# Patient Record
Sex: Female | Born: 1945
Health system: Southern US, Community
[De-identification: ages and names within clinical notes are randomized; demographics above are authoritative.]

## PROBLEM LIST (undated history)

## (undated) DIAGNOSIS — I10 Essential (primary) hypertension: Secondary | ICD-10-CM

## (undated) DIAGNOSIS — M48061 Spinal stenosis, lumbar region without neurogenic claudication: Secondary | ICD-10-CM

## (undated) DIAGNOSIS — J309 Allergic rhinitis, unspecified: Secondary | ICD-10-CM

## (undated) DIAGNOSIS — K589 Irritable bowel syndrome without diarrhea: Secondary | ICD-10-CM

## (undated) DIAGNOSIS — K219 Gastro-esophageal reflux disease without esophagitis: Secondary | ICD-10-CM

## (undated) DIAGNOSIS — Z860100 Personal history of colon polyps, unspecified: Secondary | ICD-10-CM

## (undated) DIAGNOSIS — K760 Fatty (change of) liver, not elsewhere classified: Secondary | ICD-10-CM

## (undated) DIAGNOSIS — K648 Other hemorrhoids: Secondary | ICD-10-CM

## (undated) DIAGNOSIS — M109 Gout, unspecified: Secondary | ICD-10-CM

## (undated) DIAGNOSIS — B9681 Helicobacter pylori [H. pylori] as the cause of diseases classified elsewhere: Secondary | ICD-10-CM

## (undated) DIAGNOSIS — E785 Hyperlipidemia, unspecified: Secondary | ICD-10-CM

## (undated) DIAGNOSIS — D126 Benign neoplasm of colon, unspecified: Secondary | ICD-10-CM

## (undated) DIAGNOSIS — Z9289 Personal history of other medical treatment: Secondary | ICD-10-CM

## (undated) HISTORY — DX: Fatty (change of) liver, not elsewhere classified: K76.0

## (undated) HISTORY — DX: Essential (primary) hypertension: I10

## (undated) HISTORY — DX: Gastro-esophageal reflux disease without esophagitis: K21.9

## (undated) HISTORY — DX: Personal history of colonic polyps: Z86.010

## (undated) HISTORY — DX: Benign neoplasm of colon, unspecified: D12.6

## (undated) HISTORY — DX: Gout, unspecified: M10.9

## (undated) HISTORY — DX: Hyperlipidemia, unspecified: E78.5

## (undated) HISTORY — DX: Personal history of colon polyps, unspecified: Z86.0100

## (undated) HISTORY — DX: Personal history of other medical treatment: Z92.89

## (undated) HISTORY — DX: Irritable bowel syndrome, unspecified: K58.9

## (undated) HISTORY — DX: Helicobacter pylori (H. pylori) as the cause of diseases classified elsewhere: B96.81

## (undated) HISTORY — DX: Spinal stenosis, lumbar region without neurogenic claudication: M48.061

## (undated) HISTORY — DX: Other hemorrhoids: K64.8

## (undated) HISTORY — DX: Allergic rhinitis, unspecified: J30.9

---
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
## 1983-12-11 HISTORY — PX: BREAST BIOPSY: SHX20

## 1988-12-10 HISTORY — PX: TOTAL ABDOMINAL HYSTERECTOMY W/ BILATERAL SALPINGOOPHORECTOMY: SHX83

## 1998-10-10 ENCOUNTER — Encounter: Payer: Self-pay | Admitting: Surgery

## 1998-10-10 ENCOUNTER — Ambulatory Visit: Admission: RE | Admit: 1998-10-10 | Discharge: 1998-10-10 | Payer: Self-pay | Admitting: Surgery

## 1999-12-19 ENCOUNTER — Other Ambulatory Visit: Admission: RE | Admit: 1999-12-19 | Discharge: 1999-12-19 | Payer: Self-pay | Admitting: Obstetrics and Gynecology

## 2000-01-12 ENCOUNTER — Encounter: Payer: Self-pay | Admitting: Surgery

## 2000-01-12 ENCOUNTER — Encounter: Admission: RE | Admit: 2000-01-12 | Discharge: 2000-01-12 | Payer: Self-pay | Admitting: Surgery

## 2000-02-29 ENCOUNTER — Encounter: Payer: Self-pay | Admitting: Emergency Medicine

## 2000-02-29 ENCOUNTER — Emergency Department (HOSPITAL_COMMUNITY): Admission: EM | Admit: 2000-02-29 | Discharge: 2000-02-29 | Payer: Self-pay | Admitting: Emergency Medicine

## 2000-09-12 ENCOUNTER — Other Ambulatory Visit: Admission: RE | Admit: 2000-09-12 | Discharge: 2000-09-12 | Payer: Self-pay | Admitting: Surgery

## 2000-09-19 ENCOUNTER — Encounter: Admission: RE | Admit: 2000-09-19 | Discharge: 2000-09-19 | Payer: Self-pay | Admitting: Surgery

## 2000-09-19 ENCOUNTER — Other Ambulatory Visit: Admission: RE | Admit: 2000-09-19 | Discharge: 2000-09-19 | Payer: Self-pay | Admitting: Surgery

## 2000-09-19 ENCOUNTER — Encounter: Payer: Self-pay | Admitting: Surgery

## 2000-09-19 ENCOUNTER — Encounter (INDEPENDENT_AMBULATORY_CARE_PROVIDER_SITE_OTHER): Payer: Self-pay | Admitting: *Deleted

## 2001-02-05 ENCOUNTER — Encounter: Payer: Self-pay | Admitting: Surgery

## 2001-02-05 ENCOUNTER — Encounter: Admission: RE | Admit: 2001-02-05 | Discharge: 2001-02-05 | Payer: Self-pay | Admitting: Surgery

## 2001-12-29 ENCOUNTER — Other Ambulatory Visit: Admission: RE | Admit: 2001-12-29 | Discharge: 2001-12-29 | Payer: Self-pay | Admitting: Surgery

## 2002-02-10 ENCOUNTER — Encounter: Admission: RE | Admit: 2002-02-10 | Discharge: 2002-02-10 | Payer: Self-pay | Admitting: Surgery

## 2002-02-10 ENCOUNTER — Encounter: Payer: Self-pay | Admitting: Surgery

## 2003-08-30 ENCOUNTER — Encounter: Admission: RE | Admit: 2003-08-30 | Discharge: 2003-08-30 | Payer: Self-pay | Admitting: Surgery

## 2003-08-30 ENCOUNTER — Encounter: Payer: Self-pay | Admitting: Surgery

## 2003-12-01 ENCOUNTER — Ambulatory Visit (HOSPITAL_COMMUNITY): Admission: RE | Admit: 2003-12-01 | Discharge: 2003-12-01 | Payer: Self-pay | Admitting: Internal Medicine

## 2006-02-18 ENCOUNTER — Encounter: Admission: RE | Admit: 2006-02-18 | Discharge: 2006-02-18 | Payer: Self-pay | Admitting: Surgery

## 2006-05-27 ENCOUNTER — Ambulatory Visit: Payer: Self-pay | Admitting: Oncology

## 2006-06-07 LAB — CBC WITH DIFFERENTIAL/PLATELET
BASO%: 0.6 % (ref 0.0–2.0)
Basophils Absolute: 0 10*3/uL (ref 0.0–0.1)
EOS%: 3.1 % (ref 0.0–7.0)
Eosinophils Absolute: 0.3 10*3/uL (ref 0.0–0.5)
HCT: 41.8 % (ref 34.8–46.6)
HGB: 14.6 g/dL (ref 11.6–15.9)
LYMPH%: 31.3 % (ref 14.0–48.0)
MCH: 31.2 pg (ref 26.0–34.0)
MCHC: 34.8 g/dL (ref 32.0–36.0)
MCV: 89.7 fL (ref 81.0–101.0)
MONO#: 0.8 10*3/uL (ref 0.1–0.9)
MONO%: 10 % (ref 0.0–13.0)
NEUT#: 4.6 10*3/uL (ref 1.5–6.5)
NEUT%: 55 % (ref 39.6–76.8)
Platelets: 298 10*3/uL (ref 145–400)
RBC: 4.66 10*6/uL (ref 3.70–5.32)
RDW: 12.6 % (ref 11.3–14.5)
WBC: 8.3 10*3/uL (ref 3.9–10.0)
lymph#: 2.6 10*3/uL (ref 0.9–3.3)

## 2006-06-07 LAB — COMPREHENSIVE METABOLIC PANEL
ALT: 53 U/L — ABNORMAL HIGH (ref 0–40)
AST: 26 U/L (ref 0–37)
Albumin: 4.6 g/dL (ref 3.5–5.2)
Alkaline Phosphatase: 65 U/L (ref 39–117)
BUN: 25 mg/dL — ABNORMAL HIGH (ref 6–23)
CO2: 26 mEq/L (ref 19–32)
Calcium: 9.2 mg/dL (ref 8.4–10.5)
Chloride: 101 mEq/L (ref 96–112)
Creatinine, Ser: 1.11 mg/dL (ref 0.40–1.20)
Glucose, Bld: 137 mg/dL — ABNORMAL HIGH (ref 70–99)
Potassium: 4.5 mEq/L (ref 3.5–5.3)
Sodium: 139 mEq/L (ref 135–145)
Total Bilirubin: 0.4 mg/dL (ref 0.3–1.2)
Total Protein: 7.1 g/dL (ref 6.0–8.3)

## 2006-06-07 LAB — IRON AND TIBC
%SAT: 35 % (ref 20–55)
Iron: 108 ug/dL (ref 42–145)
TIBC: 309 ug/dL (ref 250–470)
UIBC: 201 ug/dL

## 2006-06-07 LAB — LACTATE DEHYDROGENASE: LDH: 160 U/L (ref 94–250)

## 2006-06-07 LAB — FERRITIN: Ferritin: 195 ng/mL (ref 10–291)

## 2006-09-18 ENCOUNTER — Ambulatory Visit: Payer: Self-pay | Admitting: Oncology

## 2006-09-20 LAB — IRON AND TIBC
%SAT: 36 % (ref 20–55)
Iron: 112 ug/dL (ref 42–145)
TIBC: 315 ug/dL (ref 250–470)
UIBC: 203 ug/dL

## 2006-09-20 LAB — CBC WITH DIFFERENTIAL/PLATELET
BASO%: 0.3 % (ref 0.0–2.0)
Basophils Absolute: 0 10*3/uL (ref 0.0–0.1)
EOS%: 3.7 % (ref 0.0–7.0)
Eosinophils Absolute: 0.3 10*3/uL (ref 0.0–0.5)
HCT: 42.6 % (ref 34.8–46.6)
HGB: 14.6 g/dL (ref 11.6–15.9)
LYMPH%: 29.3 % (ref 14.0–48.0)
MCH: 30.9 pg (ref 26.0–34.0)
MCHC: 34.2 g/dL (ref 32.0–36.0)
MCV: 90.4 fL (ref 81.0–101.0)
MONO#: 0.7 10*3/uL (ref 0.1–0.9)
MONO%: 8.2 % (ref 0.0–13.0)
NEUT#: 4.8 10*3/uL (ref 1.5–6.5)
NEUT%: 58.5 % (ref 39.6–76.8)
Platelets: 276 10*3/uL (ref 145–400)
RBC: 4.72 10*6/uL (ref 3.70–5.32)
RDW: 12.8 % (ref 11.3–14.5)
WBC: 8.2 10*3/uL (ref 3.9–10.0)
lymph#: 2.4 10*3/uL (ref 0.9–3.3)

## 2006-09-20 LAB — FERRITIN: Ferritin: 280 ng/mL (ref 10–291)

## 2007-06-26 ENCOUNTER — Encounter: Admission: RE | Admit: 2007-06-26 | Discharge: 2007-06-26 | Payer: Self-pay | Admitting: Obstetrics and Gynecology

## 2008-05-19 ENCOUNTER — Encounter: Admission: RE | Admit: 2008-05-19 | Discharge: 2008-05-19 | Payer: Self-pay | Admitting: Orthopedic Surgery

## 2008-07-22 ENCOUNTER — Encounter: Admission: RE | Admit: 2008-07-22 | Discharge: 2008-07-22 | Payer: Self-pay | Admitting: Obstetrics and Gynecology

## 2009-08-25 ENCOUNTER — Encounter: Payer: Self-pay | Admitting: Gastroenterology

## 2009-08-25 ENCOUNTER — Encounter: Admission: RE | Admit: 2009-08-25 | Discharge: 2009-08-25 | Payer: Self-pay | Admitting: Internal Medicine

## 2009-08-31 ENCOUNTER — Encounter: Admission: RE | Admit: 2009-08-31 | Discharge: 2009-08-31 | Payer: Self-pay | Admitting: Internal Medicine

## 2009-09-26 ENCOUNTER — Ambulatory Visit: Payer: Self-pay | Admitting: Gastroenterology

## 2009-09-26 ENCOUNTER — Encounter (INDEPENDENT_AMBULATORY_CARE_PROVIDER_SITE_OTHER): Payer: Self-pay | Admitting: *Deleted

## 2009-09-26 DIAGNOSIS — R933 Abnormal findings on diagnostic imaging of other parts of digestive tract: Secondary | ICD-10-CM | POA: Insufficient documentation

## 2009-09-26 DIAGNOSIS — R740 Nonspecific elevation of levels of transaminase and lactic acid dehydrogenase [LDH]: Secondary | ICD-10-CM

## 2009-09-26 DIAGNOSIS — K219 Gastro-esophageal reflux disease without esophagitis: Secondary | ICD-10-CM

## 2009-09-26 DIAGNOSIS — R7401 Elevation of levels of liver transaminase levels: Secondary | ICD-10-CM | POA: Insufficient documentation

## 2009-09-26 DIAGNOSIS — K589 Irritable bowel syndrome without diarrhea: Secondary | ICD-10-CM | POA: Insufficient documentation

## 2009-09-27 LAB — CONVERTED CEMR LAB
INR: 1 (ref 0.8–1.0)
Prothrombin Time: 10.7 s (ref 9.1–11.7)

## 2009-09-30 LAB — CONVERTED CEMR LAB
A-1 Antitrypsin, Ser: 121 mg/dL (ref 83–200)
Angiotensin 1 Converting Enzyme: 44 units/L (ref 9–67)
Anti Nuclear Antibody(ANA): NEGATIVE
Ceruloplasmin: 32 mg/dL (ref 21–63)
HCV Ab: NEGATIVE
Hepatitis B Surface Ag: NEGATIVE

## 2009-10-31 ENCOUNTER — Ambulatory Visit: Payer: Self-pay | Admitting: Gastroenterology

## 2009-11-08 ENCOUNTER — Encounter: Payer: Self-pay | Admitting: Gastroenterology

## 2010-09-12 ENCOUNTER — Encounter: Admission: RE | Admit: 2010-09-12 | Discharge: 2010-09-12 | Payer: Self-pay | Admitting: Internal Medicine

## 2011-06-22 IMAGING — CT CT ABDOMEN W/O CM
1 of 4 series · 14 of 36 positions shown, 18 images · IV contrast (READICAT/WATER)
Comparison: None

CT ABDOMEN

CLINICAL DATA: Abdominal pain.  Elevated liver function studies.

CT ABDOMEN AND PELVIS WITHOUT CONTRAST
TECHNIQUE: Multidetector CT imaging of the abdomen and pelvis was
performed following the standard protocol without intravenous
contrast.

[Series 2: abdomen w/ · axial · 0.80mm/px · z∈[-366,+10]mm · 14 of 86 slices shown, 18 images]
[im 7/86  soft-tissue]
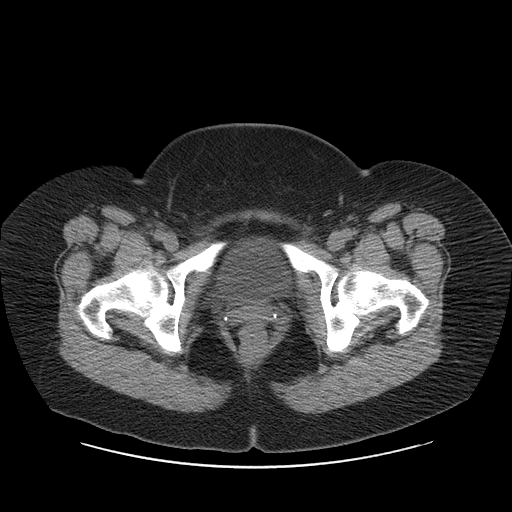
[im 7/86  bone]
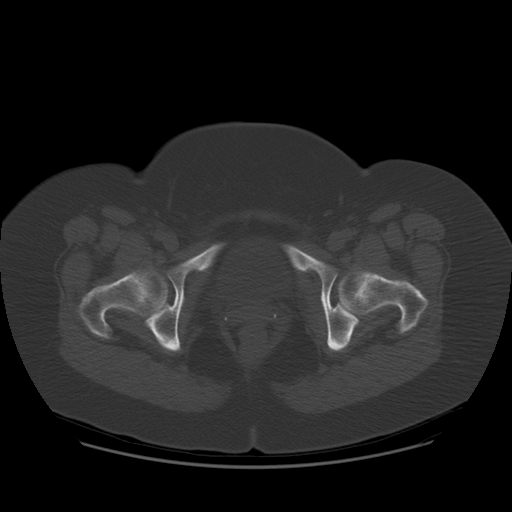
[im 14/86  soft-tissue]
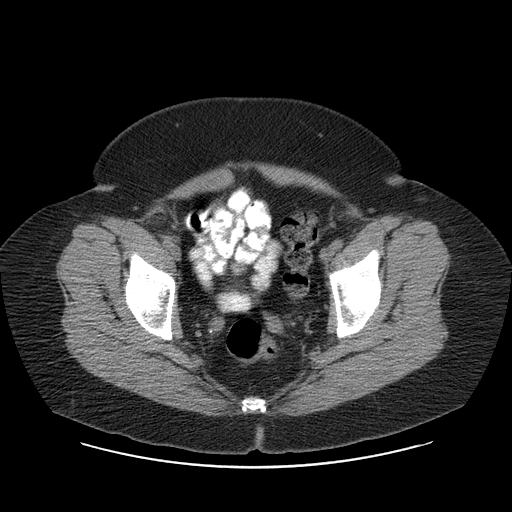
[im 21/86  soft-tissue]
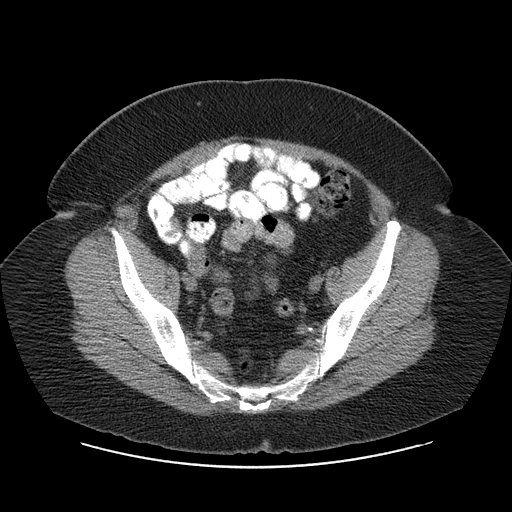
[im 28/86  soft-tissue]
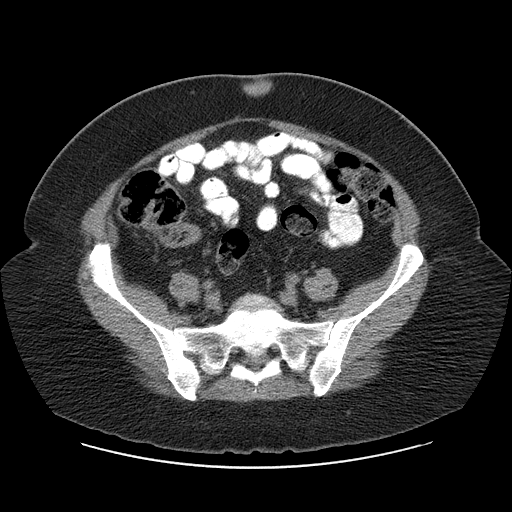
[im 35/86  soft-tissue]
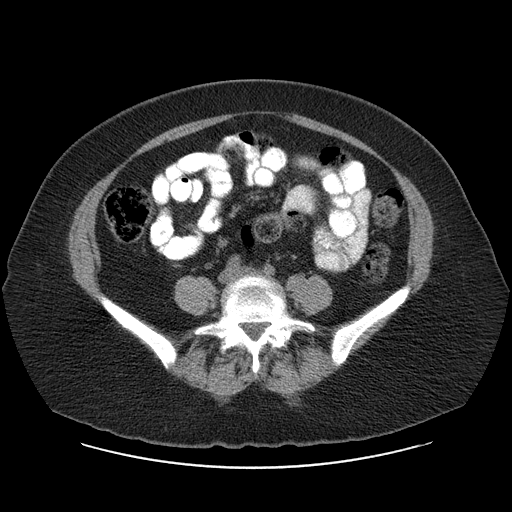
[im 41/86  soft-tissue]
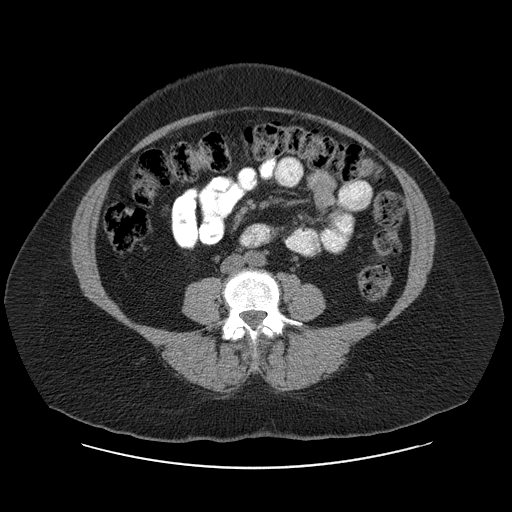
[im 48/86  soft-tissue]
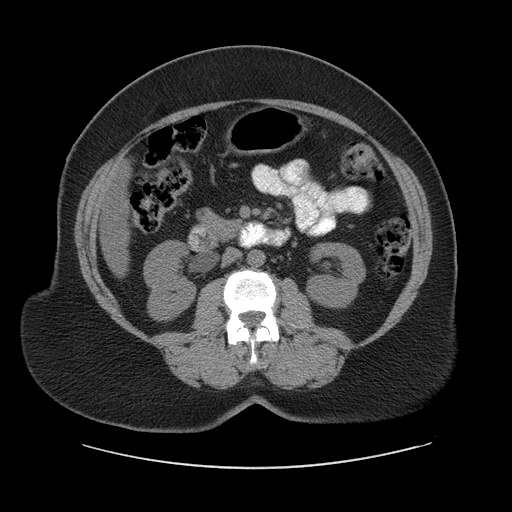
[im 55/86  soft-tissue]
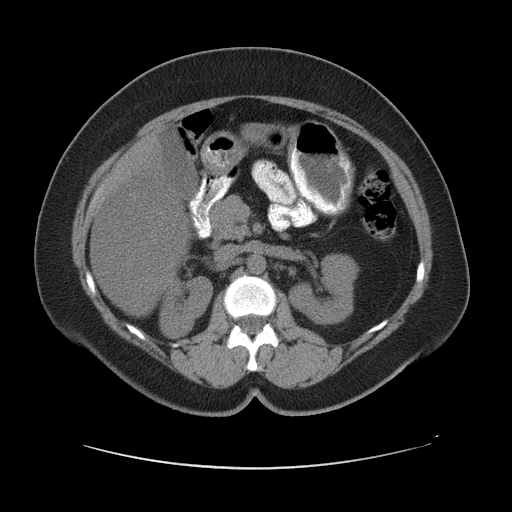
[im 62/86  soft-tissue]
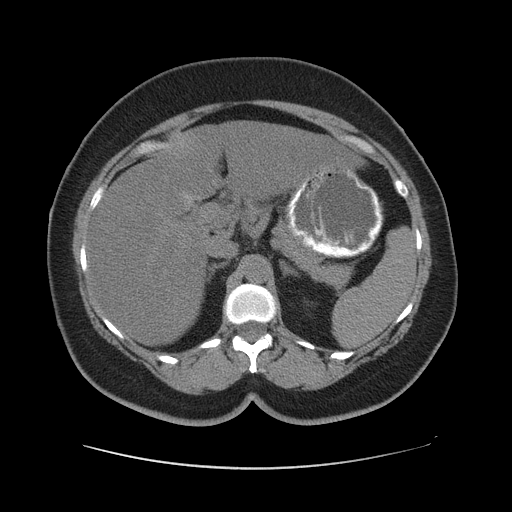
[im 62/86  bone]
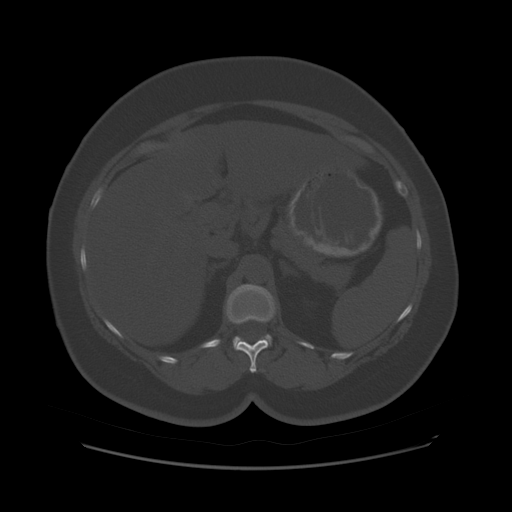
[im 69/86  soft-tissue]
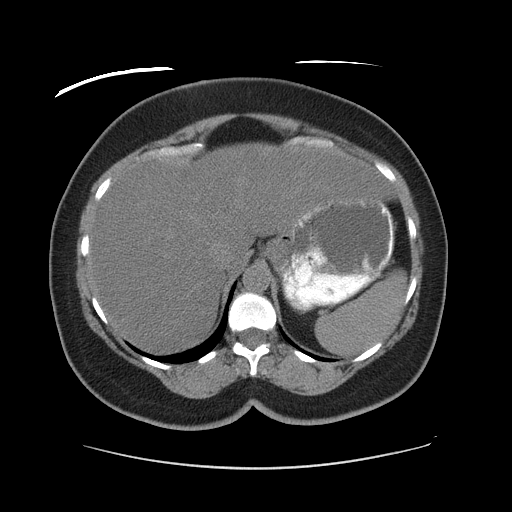
[im 72/86  lung]
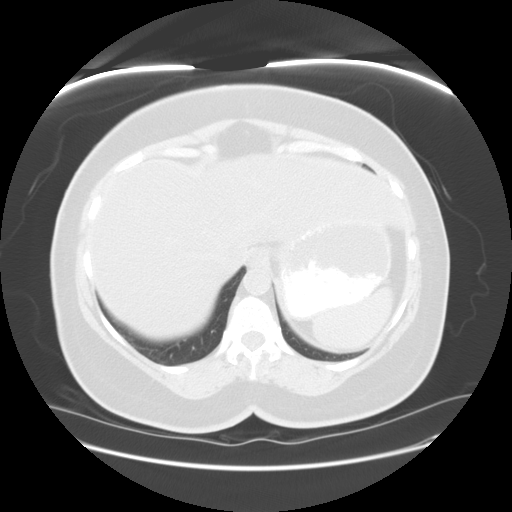
[im 75/86  soft-tissue]
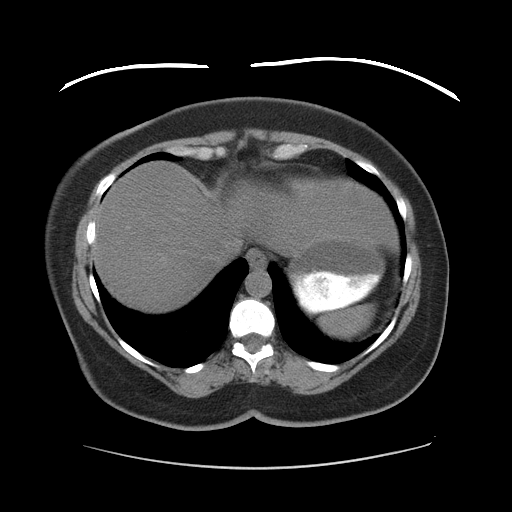
[im 75/86  lung]
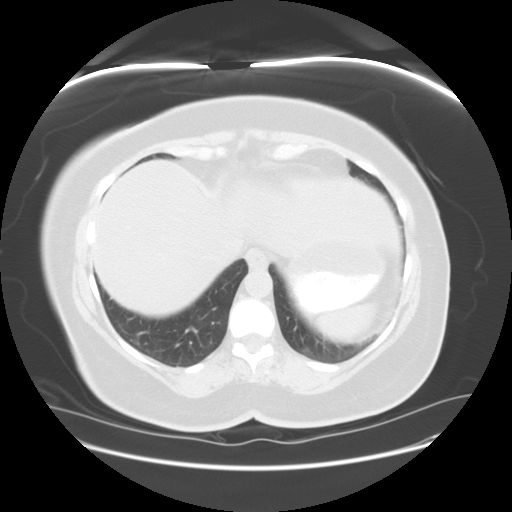
[im 79/86  lung]
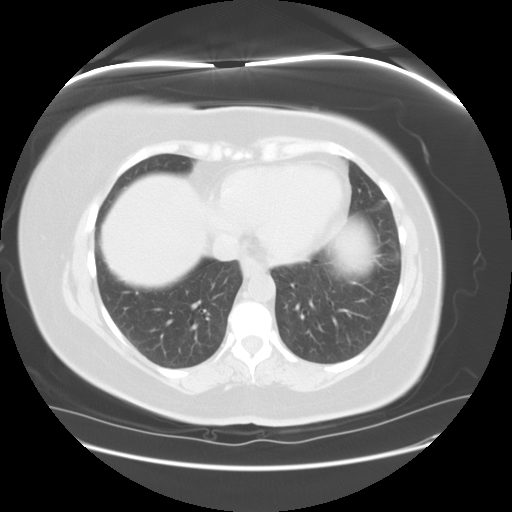
[im 82/86  soft-tissue]
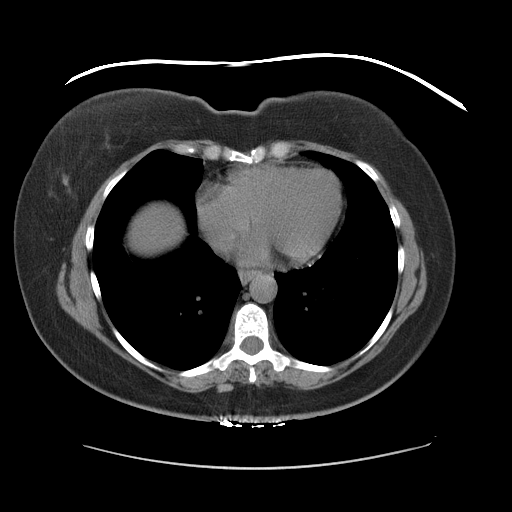
[im 82/86  lung]
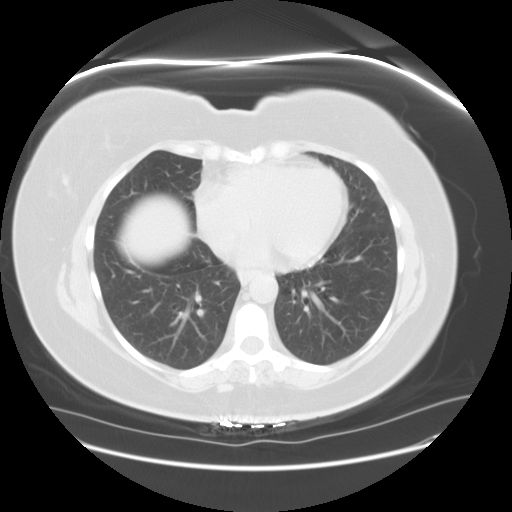

[14 of 36 positions shown; findings below may reference images not displayed]

FINDINGS: The lung bases are clear.  The heart is normal in size.
No pericardial effusion.  Mild distal esophageal wall thickening
may be due to reflux.  No definite hiatal hernia. The unenhanced
appearance of the liver demonstrates fairly diffuse fatty
infiltration of the liver.  There is some focal fatty sparing near
the gallbladder.  No worrisome hepatic lesions or intrahepatic
ductal dilatation.  The gallbladder appears normal.  No common bile
duct dilatation.  The unenhanced appearance of the pancreas is
unremarkable.  The spleen is normal in size.  The adrenal glands
are normal.  Both kidneys are somewhat lobulated in appearance.
This is probably renal scarring changes may be due to previous
infection and/or reflux.  No worrisome renal mass and no renal
calculi or hydronephrosis.

The stomach, duodenum, small bowel and colon demonstrate no
significant findings.  No mesenteric or retroperitoneal masses or
adenopathy.  The aorta is normal in caliber.  Mild atherosclerotic
changes.  No significant bony findings.
IMPRESSION: 1.  Diffuse fatty infiltration of the liver but no focal hepatic
lesions or intrahepatic ductal dilatation.
2.  Mild distal esophageal wall thickening.  This may be due to
reflux.  Recommend correlation any symptoms of dysphagia.
3.  Lobulated renal contour is most likely due to renal scarring.
No hydronephrosis or renal calculi.

CT PELVIS
FINDINGS: The rectum, sigmoid colon and visualized small bowel
loops are unremarkable.  The the patient has had a hysterectomy.
The bladder appears normal.  No distal ureteral or bladder calculi.
No pelvic mass or adenopathy.  The bony pelvis is intact.  No
inguinal mass or hernia.
IMPRESSION: 1.  Status post hysterectomy.
2.  No acute pelvic findings, mass lesions or adenopathy.
3.  No distal ureteral or bladder calculi.

## 2011-07-11 HISTORY — PX: CATARACT EXTRACTION: SUR2

## 2011-09-26 ENCOUNTER — Other Ambulatory Visit: Payer: Self-pay | Admitting: Internal Medicine

## 2011-09-26 DIAGNOSIS — Z1231 Encounter for screening mammogram for malignant neoplasm of breast: Secondary | ICD-10-CM

## 2011-10-23 ENCOUNTER — Ambulatory Visit
Admission: RE | Admit: 2011-10-23 | Discharge: 2011-10-23 | Disposition: A | Discharge status: Home / Self Care | Payer: Medicare Other | Source: Ambulatory Visit | Attending: Internal Medicine | Admitting: Internal Medicine

## 2011-10-23 DIAGNOSIS — Z1231 Encounter for screening mammogram for malignant neoplasm of breast: Secondary | ICD-10-CM

## 2011-11-02 ENCOUNTER — Other Ambulatory Visit: Payer: Self-pay | Admitting: Internal Medicine

## 2011-11-02 DIAGNOSIS — R928 Other abnormal and inconclusive findings on diagnostic imaging of breast: Secondary | ICD-10-CM

## 2011-11-15 ENCOUNTER — Ambulatory Visit
Admission: RE | Admit: 2011-11-15 | Discharge: 2011-11-15 | Disposition: A | Discharge status: Home / Self Care | Payer: Medicare Other | Source: Ambulatory Visit | Attending: Internal Medicine | Admitting: Internal Medicine

## 2011-11-15 ENCOUNTER — Other Ambulatory Visit: Payer: Self-pay | Admitting: Internal Medicine

## 2011-11-15 DIAGNOSIS — R928 Other abnormal and inconclusive findings on diagnostic imaging of breast: Secondary | ICD-10-CM

## 2011-11-15 DIAGNOSIS — N6310 Unspecified lump in the right breast, unspecified quadrant: Secondary | ICD-10-CM

## 2011-11-26 ENCOUNTER — Ambulatory Visit
Admission: RE | Admit: 2011-11-26 | Discharge: 2011-11-26 | Disposition: A | Discharge status: Home / Self Care | Payer: Medicare Other | Source: Ambulatory Visit | Attending: Internal Medicine | Admitting: Internal Medicine

## 2011-11-26 ENCOUNTER — Other Ambulatory Visit: Payer: Self-pay | Admitting: Diagnostic Radiology

## 2011-11-26 DIAGNOSIS — N6310 Unspecified lump in the right breast, unspecified quadrant: Secondary | ICD-10-CM

## 2012-09-16 ENCOUNTER — Other Ambulatory Visit: Payer: Self-pay | Admitting: Internal Medicine

## 2012-09-16 DIAGNOSIS — N6009 Solitary cyst of unspecified breast: Secondary | ICD-10-CM

## 2012-09-24 ENCOUNTER — Telehealth: Payer: Self-pay | Admitting: Obstetrics and Gynecology

## 2012-09-25 ENCOUNTER — Ambulatory Visit (INDEPENDENT_AMBULATORY_CARE_PROVIDER_SITE_OTHER): Payer: Medicare Other | Admitting: Obstetrics and Gynecology

## 2012-09-25 ENCOUNTER — Encounter: Payer: Self-pay | Admitting: Obstetrics and Gynecology

## 2012-09-25 VITALS — BP 130/70 | Ht 64.25 in | Wt 201.0 lb

## 2012-09-25 DIAGNOSIS — I10 Essential (primary) hypertension: Secondary | ICD-10-CM | POA: Insufficient documentation

## 2012-09-25 DIAGNOSIS — E785 Hyperlipidemia, unspecified: Secondary | ICD-10-CM | POA: Insufficient documentation

## 2012-09-25 DIAGNOSIS — K589 Irritable bowel syndrome without diarrhea: Secondary | ICD-10-CM | POA: Insufficient documentation

## 2012-09-25 DIAGNOSIS — K760 Fatty (change of) liver, not elsewhere classified: Secondary | ICD-10-CM | POA: Insufficient documentation

## 2012-09-25 DIAGNOSIS — M48061 Spinal stenosis, lumbar region without neurogenic claudication: Secondary | ICD-10-CM | POA: Insufficient documentation

## 2012-09-25 DIAGNOSIS — K219 Gastro-esophageal reflux disease without esophagitis: Secondary | ICD-10-CM | POA: Insufficient documentation

## 2012-09-25 DIAGNOSIS — J309 Allergic rhinitis, unspecified: Secondary | ICD-10-CM | POA: Insufficient documentation

## 2012-09-25 DIAGNOSIS — N6019 Diffuse cystic mastopathy of unspecified breast: Secondary | ICD-10-CM

## 2012-09-25 DIAGNOSIS — N951 Menopausal and female climacteric states: Secondary | ICD-10-CM | POA: Insufficient documentation

## 2012-09-25 DIAGNOSIS — Z86010 Personal history of colonic polyps: Secondary | ICD-10-CM | POA: Insufficient documentation

## 2012-09-25 MED ORDER — ESTRADIOL 0.0375 MG/24HR TD PTWK: 1.0000 | MEDICATED_PATCH | TRANSDERMAL | Status: DC

## 2012-09-25 NOTE — Progress Notes (Signed)
ANNUAL    Subjective:    Katherine Rush is a 66 y.o. female  who presents for annual exam. The pt discontinued her HRT  In June due to insurance concerns and did well for about 2 months, then started to experience hot flashes, night sweats, insomnia, urinary frequency.  Wants to restart HRT. Last Pap: 12/19/1999 WNL: Yes Regular Periods:no Contraception: hyst  Monthly Breast exam:yes Tetanus<76yrs:yes ?2010 Nl.Bladder Function:yes Daily BMs:yes IBS Healthy Diet:no Calcium:yes Mammogram:yes Date of Mammogram: 10/26/2011 Sched 10/2012 @ [facility]  Had bx of breast 12/12 Exercise:no Have often Exercise:due taking care of ill mother  Seatbelt: yes Abuse at home: no Stressful work:no Sigmoid-colonoscopy: 2010 4 polyps removed=benign Due 2015 Bone Density: Yes 2011 wnl per pt PCP: Dr. Rolan Lipa Change in PMH: right eye cataract surgery 07/2012.Right breast core bx in 11/2011 Change in FMH:no change BMI  Amenorrhea: yes Mood Swings: no Hot Flashes: yes Vaginal Dryness: no Poor Sleeping: yes Urinary Urgency: no UTI Symptoms: Pt gets up approx 3-4 nightly to use restroom HRT: no Fam Hx Osteo: no Prior Bone Scan: yes 2011- wnl per pt Osteoporosis: No Hx of Dvt: No BMI-34    The following portions of the patient's history were reviewed and updated as appropriate: allergies, current medications, past family history, past medical history, past social history, past surgical history and problem list.  Review of Systems Pertinent items are noted in HPI. Gastrointestinal:No change in bowel habits, no abdominal pain, no rectal bleeding Genitourinary:negative for dysuria, frequency, hematuria, nocturia and urinary incontinence    Objective:     There were no vitals taken for this visit.  Weight:  Wt Readings from Last 1 Encounters:  09/26/09 186 lb (84.369 kg)     BMI: There is no height or weight on file to calculate BMI. General Appearance: Alert, appropriate  appearance for age. No acute distress HEENT: Grossly normal Neck / Thyroid: Supple, no masses, nodes or enlargement Lungs: clear to auscultation bilaterally Back: No CVA tenderness Breast Exam: bilateral fibrocystic changes and No masses or nodes.No dimpling, nipple retraction or discharge. Cardiovascular: Regular rate and rhythm. S1, S2, no murmur Gastrointestinal: Soft, non-tender, no masses or organomegaly Pelvic Exam: External genitalia: normal general appearance Vaginal: atrophic mucosa and vaginal vault, well healed Cervix: removed surgically Adnexa: removed surgically Uterus: removed surgically Exam limited by body habitus Rectovaginal: normal rectal, no masses Lymphatic Exam: Non-palpable nodes in neck, clavicular, axillary, or inguinal regions Skin: no rash or abnormalities Neurologic: Normal gait and speech, no tremor  Psychiatric: Alert and oriented, appropriate affect.    Urinalysis:Not done    Assessment:    Menopause sx recurrent after discontinuing HRT   Plan:   mammogram Restart HRT Follow-up:  for annual exam  Dierdre Forth MD

## 2012-10-23 ENCOUNTER — Ambulatory Visit
Admission: RE | Admit: 2012-10-23 | Discharge: 2012-10-23 | Disposition: A | Discharge status: Home / Self Care | Payer: Medicare Other | Source: Ambulatory Visit | Attending: Internal Medicine | Admitting: Internal Medicine

## 2012-10-23 ENCOUNTER — Other Ambulatory Visit: Payer: Self-pay | Admitting: Internal Medicine

## 2012-10-23 DIAGNOSIS — N6009 Solitary cyst of unspecified breast: Secondary | ICD-10-CM

## 2012-11-19 ENCOUNTER — Encounter: Payer: Self-pay | Admitting: Obstetrics and Gynecology

## 2012-11-19 DIAGNOSIS — N6009 Solitary cyst of unspecified breast: Secondary | ICD-10-CM | POA: Insufficient documentation

## 2014-08-18 ENCOUNTER — Encounter: Payer: Self-pay | Admitting: Gastroenterology

## 2014-08-26 ENCOUNTER — Encounter: Payer: Self-pay | Admitting: Gastroenterology

## 2014-10-15 ENCOUNTER — Telehealth: Payer: Self-pay | Admitting: Gastroenterology

## 2014-10-15 ENCOUNTER — Other Ambulatory Visit: Payer: Self-pay | Admitting: Internal Medicine

## 2014-10-15 DIAGNOSIS — Z1231 Encounter for screening mammogram for malignant neoplasm of breast: Secondary | ICD-10-CM

## 2014-10-15 NOTE — Telephone Encounter (Signed)
Pt is requesting to switch providers from Dr. Fuller Plan to Dr. Hilarie Fredrickson. She says her mother sees Dr. Hilarie Fredrickson, and she would like to as well.

## 2014-10-15 NOTE — Telephone Encounter (Signed)
Dr. Fuller Plan do you approve of switch?

## 2014-11-02 ENCOUNTER — Ambulatory Visit
Admission: RE | Admit: 2014-11-02 | Discharge: 2014-11-02 | Disposition: A | Discharge status: Home / Self Care | Payer: Commercial Managed Care - HMO | Source: Ambulatory Visit | Attending: Internal Medicine | Admitting: Internal Medicine

## 2014-11-02 DIAGNOSIS — Z1231 Encounter for screening mammogram for malignant neoplasm of breast: Secondary | ICD-10-CM

## 2014-11-16 NOTE — Telephone Encounter (Signed)
OK with me, patients choice

## 2014-11-17 NOTE — Telephone Encounter (Signed)
Ok with me 

## 2014-11-17 NOTE — Telephone Encounter (Signed)
Dr. Pyrtle will you accept 

## 2014-11-18 ENCOUNTER — Encounter: Payer: Self-pay | Admitting: Internal Medicine

## 2014-11-18 NOTE — Telephone Encounter (Signed)
Patient is scheduled for a colon on Jan 27, 2015.

## 2015-01-13 ENCOUNTER — Ambulatory Visit (AMBULATORY_SURGERY_CENTER): Payer: Self-pay

## 2015-01-13 VITALS — Ht 63.5 in | Wt 195.6 lb

## 2015-01-13 DIAGNOSIS — Z860100 Personal history of colon polyps, unspecified: Secondary | ICD-10-CM

## 2015-01-13 MED ORDER — MOVIPREP 100 G PO SOLR: 1.0000 | Freq: Once | ORAL | Status: DC

## 2015-01-13 NOTE — Progress Notes (Signed)
No allergies to eggs or soy No diet/weight loss meds No home oxygen No past problems with anesthesia  Has email  Emmi instructions given for colonoscoopy

## 2015-01-27 ENCOUNTER — Encounter: Payer: Self-pay | Admitting: Internal Medicine

## 2015-01-27 ENCOUNTER — Ambulatory Visit (AMBULATORY_SURGERY_CENTER): Payer: PPO | Admitting: Internal Medicine

## 2015-01-27 VITALS — BP 123/64 | HR 74 | Temp 97.4°F | Resp 25 | Ht 63.0 in | Wt 195.0 lb

## 2015-01-27 DIAGNOSIS — D122 Benign neoplasm of ascending colon: Secondary | ICD-10-CM

## 2015-01-27 DIAGNOSIS — Z86010 Personal history of colonic polyps: Secondary | ICD-10-CM

## 2015-01-27 DIAGNOSIS — D120 Benign neoplasm of cecum: Secondary | ICD-10-CM

## 2015-01-27 DIAGNOSIS — D125 Benign neoplasm of sigmoid colon: Secondary | ICD-10-CM

## 2015-01-27 DIAGNOSIS — D124 Benign neoplasm of descending colon: Secondary | ICD-10-CM

## 2015-01-27 MED ORDER — SODIUM CHLORIDE 0.9 % IV SOLN: 500.0000 mL | INTRAVENOUS | Status: DC

## 2015-01-27 NOTE — Progress Notes (Signed)
Called to room to assist during endoscopic procedure.  Patient ID and intended procedure confirmed with present staff. Received instructions for my participation in the procedure from the performing physician.  

## 2015-01-27 NOTE — Op Note (Signed)
[facility] [place]  [address] [place], [address]   COLONOSCOPY PROCEDURE REPORT  PATIENT: Katherine Rush, Katherine Rush  MR#: [id number] BIRTHDATE: Apr 28, 1946 , 19  yrs. old GENDER: female ENDOSCOPIST: Jerene Bears, MD PROCEDURE DATE:  01/27/2015 PROCEDURE:   Colonoscopy with snare polypectomy First Screening Colonoscopy - Avg.  risk and is 50 yrs.  old or older - No.  Prior Negative Screening - Now for repeat screening. N/A  History of Adenoma - Now for follow-up colonoscopy & has been > or = to 3 yrs.  Yes hx of adenoma.  Has been 3 or more years since last colonoscopy.  Polyps Removed Today? Yes. ASA CLASS:   Class II INDICATIONS:high risk patient with personal history of colonic polyps and last colonoscopy Oct 2010 (Dr.  Fuller Plan). MEDICATIONS: Monitored anesthesia care and Propofol 350 mg IV  DESCRIPTION OF PROCEDURE:   After the risks benefits and alternatives of the procedure were thoroughly explained, informed consent was obtained.  The digital rectal exam revealed several skin tags.   The LB [id number]  endoscope was introduced through the anus and advanced to the cecum, which was identified by both the appendix and ileocecal valve. No adverse events experienced.   The quality of the prep was good, using MoviPrep The instrument was then slowly withdrawn as the colon was fully examined.   COLON FINDINGS: Melanosis coli was found throughout the entire examined colon.   Five sessile polyps ranging between 3-44mm in size were found in the ascending colon (1), at the cecum (1), in the sigmoid colon (1), and descending colon (2).  Polypectomies were performed with a cold snare.  The resection was complete, the polyp tissue was completely retrieved and sent to histology.  Retroflexed views revealed internal hemorrhoids. The time to cecum=9 minutes 30 seconds.  Withdrawal time=15 minutes 46 seconds.  The scope was withdrawn and the procedure completed. COMPLICATIONS:  There were no immediate complications.  ENDOSCOPIC IMPRESSION: 1.   Melanosis coli was found throughout the entire examined colon 2.   Five sessile polyps ranging between 3-58mm in size were found in the ascending colon, at the cecum, in the sigmoid colon, and descending colon; polypectomies were performed with a cold snare  RECOMMENDATIONS: 1.  Avoid all NSAIDS for the next 2 weeks. 2.  Await pathology results 3.  Timing of repeat colonoscopy will be determined by pathology findings. 4.  You will receive a letter within 1-2 weeks with the results of your biopsy as well as final recommendations.  Please call my office if you have not received a letter after 3 weeks.  eSigned:  Jerene Bears, MD 01/27/2015 10:19 AM  cc: The Patient and Burnard Bunting, MD

## 2015-01-27 NOTE — Patient Instructions (Signed)
YOU HAD AN ENDOSCOPIC PROCEDURE TODAY AT THE [facility]: Refer to the procedure report that was given to you for any specific questions about what was found during the examination.  If the procedure report does not answer your questions, please call your gastroenterologist to clarify.  If you requested that your care partner not be given the details of your procedure findings, then the procedure report has been included in a sealed envelope for you to review at your convenience later.  YOU SHOULD EXPECT: Some feelings of bloating in the abdomen. Passage of more gas than usual.  Walking can help get rid of the air that was put into your GI tract during the procedure and reduce the bloating. If you had a lower endoscopy (such as a colonoscopy or flexible sigmoidoscopy) you may notice spotting of blood in your stool or on the toilet paper. If you underwent a bowel prep for your procedure, then you may not have a normal bowel movement for a few days.  DIET: Your first meal following the procedure should be a light meal and then it is ok to progress to your normal diet.  A half-sandwich or bowl of soup is an example of a good first meal.  Heavy or fried foods are harder to digest and may make you feel nauseous or bloated.  Likewise meals heavy in dairy and vegetables can cause extra gas to form and this can also increase the bloating.  Drink plenty of fluids but you should avoid alcoholic beverages for 24 hours.  ACTIVITY: Your care partner should take you home directly after the procedure.  You should plan to take it easy, moving slowly for the rest of the day.  You can resume normal activity the day after the procedure however you should NOT DRIVE or use heavy machinery for 24 hours (because of the sedation medicines used during the test).    SYMPTOMS TO REPORT IMMEDIATELY: A gastroenterologist can be reached at any hour.  During normal business hours, 8:30 AM to 5:00 PM Monday through Friday,  call [phone].  After hours and on weekends, please call the GI answering service at [phone] who will take a message and have the physician on call contact you.   Following lower endoscopy (colonoscopy or flexible sigmoidoscopy):  Excessive amounts of blood in the stool  Significant tenderness or worsening of abdominal pains  Swelling of the abdomen that is new, acute  Fever of 100F or higher  FOLLOW UP: If any biopsies were taken you will be contacted by phone or by letter within the next 1-3 weeks.  Call your gastroenterologist if you have not heard about the biopsies in 3 weeks.  Our staff will call the home number listed on your records the next business day following your procedure to check on you and address any questions or concerns that you may have at that time regarding the information given to you following your procedure. This is a courtesy call and so if there is no answer at the home number and we have not heard from you through the emergency physician on call, we will assume that you have returned to your regular daily activities without incident.  SIGNATURES/CONFIDENTIALITY: You and/or your care partner have signed paperwork which will be entered into your electronic medical record.  These signatures attest to the fact that that the information above on your After Visit Summary has been reviewed and is understood.  Full responsibility of the confidentiality of this   discharge information lies with you and/or your care-partner.  Await pathology  No NSAIDS (Advil, Aleve, Ibuprofen, Motrin), Diclofenac for 2 weeks  Continue your other medications  You might note some irritation in your nose or some drainage.  This may cause feelings of congestion.  This is from the oxygen, which can be irritating.  There is no need for concern, this should clear up in a day or so.

## 2015-01-27 NOTE — Progress Notes (Signed)
Patient awakening,vss,report to rn 

## 2015-01-28 ENCOUNTER — Telehealth: Payer: Self-pay

## 2015-01-28 NOTE — Telephone Encounter (Signed)
  Follow up Call-  Call back number 01/27/2015  Post procedure Call Back phone  # [phone]  Permission to leave phone message Yes     Patient questions:  Do you have a fever, pain , or abdominal swelling? No. Pain Score  0 *  Have you tolerated food without any problems? Yes.    Have you been able to return to your normal activities? Yes.    Do you have any questions about your discharge instructions: Diet   No. Medications  No. Follow up visit  No.  Do you have questions or concerns about your Care? No.  Actions: * If pain score is 4 or above: No action needed, pain <4.

## 2015-02-03 ENCOUNTER — Encounter: Payer: Self-pay | Admitting: Internal Medicine

## 2016-04-30 DIAGNOSIS — M4806 Spinal stenosis, lumbar region: Secondary | ICD-10-CM | POA: Diagnosis not present

## 2016-04-30 DIAGNOSIS — Z6833 Body mass index (BMI) 33.0-33.9, adult: Secondary | ICD-10-CM | POA: Diagnosis not present

## 2016-04-30 DIAGNOSIS — I10 Essential (primary) hypertension: Secondary | ICD-10-CM | POA: Diagnosis not present

## 2016-04-30 DIAGNOSIS — I129 Hypertensive chronic kidney disease with stage 1 through stage 4 chronic kidney disease, or unspecified chronic kidney disease: Secondary | ICD-10-CM | POA: Diagnosis not present

## 2016-04-30 DIAGNOSIS — K760 Fatty (change of) liver, not elsewhere classified: Secondary | ICD-10-CM | POA: Diagnosis not present

## 2016-04-30 DIAGNOSIS — K219 Gastro-esophageal reflux disease without esophagitis: Secondary | ICD-10-CM | POA: Diagnosis not present

## 2016-04-30 DIAGNOSIS — Z1389 Encounter for screening for other disorder: Secondary | ICD-10-CM | POA: Diagnosis not present

## 2016-04-30 DIAGNOSIS — E784 Other hyperlipidemia: Secondary | ICD-10-CM | POA: Diagnosis not present

## 2016-04-30 DIAGNOSIS — N183 Chronic kidney disease, stage 3 (moderate): Secondary | ICD-10-CM | POA: Diagnosis not present

## 2016-04-30 DIAGNOSIS — K589 Irritable bowel syndrome without diarrhea: Secondary | ICD-10-CM | POA: Diagnosis not present

## 2016-04-30 DIAGNOSIS — J301 Allergic rhinitis due to pollen: Secondary | ICD-10-CM | POA: Diagnosis not present

## 2016-05-04 DIAGNOSIS — H52223 Regular astigmatism, bilateral: Secondary | ICD-10-CM | POA: Diagnosis not present

## 2016-05-04 DIAGNOSIS — H35033 Hypertensive retinopathy, bilateral: Secondary | ICD-10-CM | POA: Diagnosis not present

## 2016-05-04 DIAGNOSIS — H5212 Myopia, left eye: Secondary | ICD-10-CM | POA: Diagnosis not present

## 2016-05-04 DIAGNOSIS — H26492 Other secondary cataract, left eye: Secondary | ICD-10-CM | POA: Diagnosis not present

## 2016-05-04 DIAGNOSIS — H5201 Hypermetropia, right eye: Secondary | ICD-10-CM | POA: Diagnosis not present

## 2016-05-04 DIAGNOSIS — I10 Essential (primary) hypertension: Secondary | ICD-10-CM | POA: Diagnosis not present

## 2016-05-04 DIAGNOSIS — Z961 Presence of intraocular lens: Secondary | ICD-10-CM | POA: Diagnosis not present

## 2016-05-04 DIAGNOSIS — H524 Presbyopia: Secondary | ICD-10-CM | POA: Diagnosis not present

## 2016-05-04 DIAGNOSIS — H1045 Other chronic allergic conjunctivitis: Secondary | ICD-10-CM | POA: Diagnosis not present

## 2016-05-31 DIAGNOSIS — H1859 Other hereditary corneal dystrophies: Secondary | ICD-10-CM | POA: Diagnosis not present

## 2016-06-06 DIAGNOSIS — H18413 Arcus senilis, bilateral: Secondary | ICD-10-CM | POA: Diagnosis not present

## 2016-06-06 DIAGNOSIS — Z961 Presence of intraocular lens: Secondary | ICD-10-CM | POA: Diagnosis not present

## 2016-06-06 DIAGNOSIS — H1852 Epithelial (juvenile) corneal dystrophy: Secondary | ICD-10-CM | POA: Diagnosis not present

## 2016-06-14 DIAGNOSIS — H1851 Endothelial corneal dystrophy: Secondary | ICD-10-CM | POA: Diagnosis not present

## 2016-06-14 DIAGNOSIS — H52211 Irregular astigmatism, right eye: Secondary | ICD-10-CM | POA: Diagnosis not present

## 2016-06-14 DIAGNOSIS — H18411 Arcus senilis, right eye: Secondary | ICD-10-CM | POA: Diagnosis not present

## 2016-09-08 DIAGNOSIS — Z23 Encounter for immunization: Secondary | ICD-10-CM | POA: Diagnosis not present

## 2016-10-25 DIAGNOSIS — E784 Other hyperlipidemia: Secondary | ICD-10-CM | POA: Diagnosis not present

## 2016-10-25 DIAGNOSIS — I10 Essential (primary) hypertension: Secondary | ICD-10-CM | POA: Diagnosis not present

## 2016-10-30 DIAGNOSIS — Z6836 Body mass index (BMI) 36.0-36.9, adult: Secondary | ICD-10-CM | POA: Diagnosis not present

## 2016-10-30 DIAGNOSIS — Z6833 Body mass index (BMI) 33.0-33.9, adult: Secondary | ICD-10-CM | POA: Diagnosis not present

## 2016-10-30 DIAGNOSIS — K760 Fatty (change of) liver, not elsewhere classified: Secondary | ICD-10-CM | POA: Diagnosis not present

## 2016-10-30 DIAGNOSIS — J309 Allergic rhinitis, unspecified: Secondary | ICD-10-CM | POA: Diagnosis not present

## 2016-10-30 DIAGNOSIS — I10 Essential (primary) hypertension: Secondary | ICD-10-CM | POA: Diagnosis not present

## 2016-10-30 DIAGNOSIS — L292 Pruritus vulvae: Secondary | ICD-10-CM | POA: Diagnosis not present

## 2016-10-30 DIAGNOSIS — K219 Gastro-esophageal reflux disease without esophagitis: Secondary | ICD-10-CM | POA: Diagnosis not present

## 2016-10-30 DIAGNOSIS — N183 Chronic kidney disease, stage 3 (moderate): Secondary | ICD-10-CM | POA: Diagnosis not present

## 2016-10-30 DIAGNOSIS — Z1231 Encounter for screening mammogram for malignant neoplasm of breast: Secondary | ICD-10-CM | POA: Diagnosis not present

## 2016-10-30 DIAGNOSIS — Z01419 Encounter for gynecological examination (general) (routine) without abnormal findings: Secondary | ICD-10-CM | POA: Diagnosis not present

## 2016-10-30 DIAGNOSIS — Z23 Encounter for immunization: Secondary | ICD-10-CM | POA: Diagnosis not present

## 2016-10-30 DIAGNOSIS — I129 Hypertensive chronic kidney disease with stage 1 through stage 4 chronic kidney disease, or unspecified chronic kidney disease: Secondary | ICD-10-CM | POA: Diagnosis not present

## 2016-10-30 DIAGNOSIS — E784 Other hyperlipidemia: Secondary | ICD-10-CM | POA: Diagnosis not present

## 2016-10-30 DIAGNOSIS — Z0000 Encounter for general adult medical examination without abnormal findings: Secondary | ICD-10-CM | POA: Diagnosis not present

## 2016-11-22 DIAGNOSIS — Z1212 Encounter for screening for malignant neoplasm of rectum: Secondary | ICD-10-CM | POA: Diagnosis not present

## 2016-11-28 DIAGNOSIS — H1852 Epithelial (juvenile) corneal dystrophy: Secondary | ICD-10-CM | POA: Diagnosis not present

## 2017-05-15 DIAGNOSIS — K219 Gastro-esophageal reflux disease without esophagitis: Secondary | ICD-10-CM | POA: Diagnosis not present

## 2017-05-15 DIAGNOSIS — N183 Chronic kidney disease, stage 3 (moderate): Secondary | ICD-10-CM | POA: Diagnosis not present

## 2017-05-15 DIAGNOSIS — E784 Other hyperlipidemia: Secondary | ICD-10-CM | POA: Diagnosis not present

## 2017-05-15 DIAGNOSIS — K589 Irritable bowel syndrome without diarrhea: Secondary | ICD-10-CM | POA: Diagnosis not present

## 2017-05-15 DIAGNOSIS — Z1389 Encounter for screening for other disorder: Secondary | ICD-10-CM | POA: Diagnosis not present

## 2017-05-15 DIAGNOSIS — Z6832 Body mass index (BMI) 32.0-32.9, adult: Secondary | ICD-10-CM | POA: Diagnosis not present

## 2017-05-15 DIAGNOSIS — K760 Fatty (change of) liver, not elsewhere classified: Secondary | ICD-10-CM | POA: Diagnosis not present

## 2017-05-15 DIAGNOSIS — M79643 Pain in unspecified hand: Secondary | ICD-10-CM | POA: Diagnosis not present

## 2017-05-15 DIAGNOSIS — I129 Hypertensive chronic kidney disease with stage 1 through stage 4 chronic kidney disease, or unspecified chronic kidney disease: Secondary | ICD-10-CM | POA: Diagnosis not present

## 2017-05-15 DIAGNOSIS — I10 Essential (primary) hypertension: Secondary | ICD-10-CM | POA: Diagnosis not present

## 2017-05-15 DIAGNOSIS — J309 Allergic rhinitis, unspecified: Secondary | ICD-10-CM | POA: Diagnosis not present

## 2017-05-29 DIAGNOSIS — W57XXXA Bitten or stung by nonvenomous insect and other nonvenomous arthropods, initial encounter: Secondary | ICD-10-CM | POA: Diagnosis not present

## 2017-05-29 DIAGNOSIS — Z6832 Body mass index (BMI) 32.0-32.9, adult: Secondary | ICD-10-CM | POA: Diagnosis not present

## 2017-05-29 DIAGNOSIS — R509 Fever, unspecified: Secondary | ICD-10-CM | POA: Diagnosis not present

## 2017-05-29 DIAGNOSIS — J309 Allergic rhinitis, unspecified: Secondary | ICD-10-CM | POA: Diagnosis not present

## 2017-09-21 DIAGNOSIS — Z23 Encounter for immunization: Secondary | ICD-10-CM | POA: Diagnosis not present

## 2017-11-12 DIAGNOSIS — I10 Essential (primary) hypertension: Secondary | ICD-10-CM | POA: Diagnosis not present

## 2017-11-12 DIAGNOSIS — E7849 Other hyperlipidemia: Secondary | ICD-10-CM | POA: Diagnosis not present

## 2017-11-12 DIAGNOSIS — R82998 Other abnormal findings in urine: Secondary | ICD-10-CM | POA: Diagnosis not present

## 2017-11-25 DIAGNOSIS — E7849 Other hyperlipidemia: Secondary | ICD-10-CM | POA: Diagnosis not present

## 2017-11-25 DIAGNOSIS — Z0000 Encounter for general adult medical examination without abnormal findings: Secondary | ICD-10-CM | POA: Diagnosis not present

## 2017-11-25 DIAGNOSIS — Z6832 Body mass index (BMI) 32.0-32.9, adult: Secondary | ICD-10-CM | POA: Diagnosis not present

## 2017-11-25 DIAGNOSIS — M7661 Achilles tendinitis, right leg: Secondary | ICD-10-CM | POA: Diagnosis not present

## 2017-11-25 DIAGNOSIS — I10 Essential (primary) hypertension: Secondary | ICD-10-CM | POA: Diagnosis not present

## 2017-11-25 DIAGNOSIS — Z1212 Encounter for screening for malignant neoplasm of rectum: Secondary | ICD-10-CM | POA: Diagnosis not present

## 2017-11-25 DIAGNOSIS — K760 Fatty (change of) liver, not elsewhere classified: Secondary | ICD-10-CM | POA: Diagnosis not present

## 2017-11-25 DIAGNOSIS — N183 Chronic kidney disease, stage 3 (moderate): Secondary | ICD-10-CM | POA: Diagnosis not present

## 2017-11-25 DIAGNOSIS — I129 Hypertensive chronic kidney disease with stage 1 through stage 4 chronic kidney disease, or unspecified chronic kidney disease: Secondary | ICD-10-CM | POA: Diagnosis not present

## 2017-11-25 DIAGNOSIS — Z1389 Encounter for screening for other disorder: Secondary | ICD-10-CM | POA: Diagnosis not present

## 2018-02-25 DIAGNOSIS — Z6833 Body mass index (BMI) 33.0-33.9, adult: Secondary | ICD-10-CM | POA: Diagnosis not present

## 2018-02-25 DIAGNOSIS — R05 Cough: Secondary | ICD-10-CM | POA: Diagnosis not present

## 2018-02-25 DIAGNOSIS — I10 Essential (primary) hypertension: Secondary | ICD-10-CM | POA: Diagnosis not present

## 2018-02-25 DIAGNOSIS — J3089 Other allergic rhinitis: Secondary | ICD-10-CM | POA: Diagnosis not present

## 2018-04-30 DIAGNOSIS — M7662 Achilles tendinitis, left leg: Secondary | ICD-10-CM | POA: Diagnosis not present

## 2018-04-30 DIAGNOSIS — M79671 Pain in right foot: Secondary | ICD-10-CM | POA: Diagnosis not present

## 2018-04-30 DIAGNOSIS — M79672 Pain in left foot: Secondary | ICD-10-CM | POA: Diagnosis not present

## 2018-04-30 DIAGNOSIS — M7661 Achilles tendinitis, right leg: Secondary | ICD-10-CM | POA: Diagnosis not present

## 2018-07-29 DIAGNOSIS — H9202 Otalgia, left ear: Secondary | ICD-10-CM | POA: Diagnosis not present

## 2018-07-29 DIAGNOSIS — M26621 Arthralgia of right temporomandibular joint: Secondary | ICD-10-CM | POA: Diagnosis not present

## 2018-07-29 DIAGNOSIS — J302 Other seasonal allergic rhinitis: Secondary | ICD-10-CM | POA: Diagnosis not present

## 2018-09-06 DIAGNOSIS — Z23 Encounter for immunization: Secondary | ICD-10-CM | POA: Diagnosis not present

## 2018-11-19 DIAGNOSIS — M7661 Achilles tendinitis, right leg: Secondary | ICD-10-CM | POA: Diagnosis not present

## 2018-11-19 DIAGNOSIS — M7662 Achilles tendinitis, left leg: Secondary | ICD-10-CM | POA: Diagnosis not present

## 2018-12-17 DIAGNOSIS — I10 Essential (primary) hypertension: Secondary | ICD-10-CM | POA: Diagnosis not present

## 2018-12-17 DIAGNOSIS — E7849 Other hyperlipidemia: Secondary | ICD-10-CM | POA: Diagnosis not present

## 2018-12-17 DIAGNOSIS — R82998 Other abnormal findings in urine: Secondary | ICD-10-CM | POA: Diagnosis not present

## 2018-12-24 DIAGNOSIS — Z1331 Encounter for screening for depression: Secondary | ICD-10-CM | POA: Diagnosis not present

## 2018-12-24 DIAGNOSIS — M48061 Spinal stenosis, lumbar region without neurogenic claudication: Secondary | ICD-10-CM | POA: Diagnosis not present

## 2018-12-24 DIAGNOSIS — I129 Hypertensive chronic kidney disease with stage 1 through stage 4 chronic kidney disease, or unspecified chronic kidney disease: Secondary | ICD-10-CM | POA: Diagnosis not present

## 2018-12-24 DIAGNOSIS — E7849 Other hyperlipidemia: Secondary | ICD-10-CM | POA: Diagnosis not present

## 2018-12-24 DIAGNOSIS — J309 Allergic rhinitis, unspecified: Secondary | ICD-10-CM | POA: Diagnosis not present

## 2018-12-24 DIAGNOSIS — E663 Overweight: Secondary | ICD-10-CM | POA: Diagnosis not present

## 2018-12-24 DIAGNOSIS — K760 Fatty (change of) liver, not elsewhere classified: Secondary | ICD-10-CM | POA: Diagnosis not present

## 2018-12-24 DIAGNOSIS — M7661 Achilles tendinitis, right leg: Secondary | ICD-10-CM | POA: Diagnosis not present

## 2018-12-24 DIAGNOSIS — I10 Essential (primary) hypertension: Secondary | ICD-10-CM | POA: Diagnosis not present

## 2018-12-24 DIAGNOSIS — M7662 Achilles tendinitis, left leg: Secondary | ICD-10-CM | POA: Diagnosis not present

## 2018-12-24 DIAGNOSIS — N183 Chronic kidney disease, stage 3 (moderate): Secondary | ICD-10-CM | POA: Diagnosis not present

## 2018-12-24 DIAGNOSIS — K589 Irritable bowel syndrome without diarrhea: Secondary | ICD-10-CM | POA: Diagnosis not present

## 2018-12-24 DIAGNOSIS — Z0000 Encounter for general adult medical examination without abnormal findings: Secondary | ICD-10-CM | POA: Diagnosis not present

## 2018-12-24 DIAGNOSIS — Z6828 Body mass index (BMI) 28.0-28.9, adult: Secondary | ICD-10-CM | POA: Diagnosis not present

## 2018-12-31 DIAGNOSIS — Z1212 Encounter for screening for malignant neoplasm of rectum: Secondary | ICD-10-CM | POA: Diagnosis not present

## 2019-01-08 DIAGNOSIS — M7662 Achilles tendinitis, left leg: Secondary | ICD-10-CM | POA: Diagnosis not present

## 2019-01-08 DIAGNOSIS — M7661 Achilles tendinitis, right leg: Secondary | ICD-10-CM | POA: Diagnosis not present

## 2019-01-22 DIAGNOSIS — M7662 Achilles tendinitis, left leg: Secondary | ICD-10-CM | POA: Diagnosis not present

## 2019-01-22 DIAGNOSIS — M7661 Achilles tendinitis, right leg: Secondary | ICD-10-CM | POA: Diagnosis not present

## 2019-04-22 MED FILL — DICLOFENAC SODIUM 75 MG TAB: 75 | 30 days supply | Qty: 60 | Fill #0

## 2019-04-22 MED FILL — OLMESARTAN-HCTZ 40-25 MG TA: 40-25 | 30 days supply | Qty: 15 | Fill #0

## 2019-06-29 DIAGNOSIS — K589 Irritable bowel syndrome without diarrhea: Secondary | ICD-10-CM | POA: Diagnosis not present

## 2019-06-29 DIAGNOSIS — N183 Chronic kidney disease, stage 3 (moderate): Secondary | ICD-10-CM | POA: Diagnosis not present

## 2019-06-29 DIAGNOSIS — J309 Allergic rhinitis, unspecified: Secondary | ICD-10-CM | POA: Diagnosis not present

## 2019-06-29 DIAGNOSIS — K219 Gastro-esophageal reflux disease without esophagitis: Secondary | ICD-10-CM | POA: Diagnosis not present

## 2019-06-29 DIAGNOSIS — E785 Hyperlipidemia, unspecified: Secondary | ICD-10-CM | POA: Diagnosis not present

## 2019-06-29 DIAGNOSIS — E663 Overweight: Secondary | ICD-10-CM | POA: Diagnosis not present

## 2019-06-29 DIAGNOSIS — I129 Hypertensive chronic kidney disease with stage 1 through stage 4 chronic kidney disease, or unspecified chronic kidney disease: Secondary | ICD-10-CM | POA: Diagnosis not present

## 2019-06-29 DIAGNOSIS — K760 Fatty (change of) liver, not elsewhere classified: Secondary | ICD-10-CM | POA: Diagnosis not present

## 2019-06-29 DIAGNOSIS — M48061 Spinal stenosis, lumbar region without neurogenic claudication: Secondary | ICD-10-CM | POA: Diagnosis not present

## 2019-06-29 DIAGNOSIS — I10 Essential (primary) hypertension: Secondary | ICD-10-CM | POA: Diagnosis not present

## 2019-06-29 DIAGNOSIS — M7661 Achilles tendinitis, right leg: Secondary | ICD-10-CM | POA: Diagnosis not present

## 2019-09-12 DIAGNOSIS — Z23 Encounter for immunization: Secondary | ICD-10-CM | POA: Diagnosis not present

## 2019-11-27 DIAGNOSIS — Z20828 Contact with and (suspected) exposure to other viral communicable diseases: Secondary | ICD-10-CM | POA: Diagnosis not present

## 2019-12-28 DIAGNOSIS — E7849 Other hyperlipidemia: Secondary | ICD-10-CM | POA: Diagnosis not present

## 2020-01-01 ENCOUNTER — Encounter: Payer: Self-pay | Admitting: *Deleted

## 2020-01-06 DIAGNOSIS — I129 Hypertensive chronic kidney disease with stage 1 through stage 4 chronic kidney disease, or unspecified chronic kidney disease: Secondary | ICD-10-CM | POA: Diagnosis not present

## 2020-01-06 DIAGNOSIS — M48061 Spinal stenosis, lumbar region without neurogenic claudication: Secondary | ICD-10-CM | POA: Diagnosis not present

## 2020-01-06 DIAGNOSIS — Z0000 Encounter for general adult medical examination without abnormal findings: Secondary | ICD-10-CM | POA: Diagnosis not present

## 2020-01-06 DIAGNOSIS — Z1339 Encounter for screening examination for other mental health and behavioral disorders: Secondary | ICD-10-CM | POA: Diagnosis not present

## 2020-01-06 DIAGNOSIS — Z1331 Encounter for screening for depression: Secondary | ICD-10-CM | POA: Diagnosis not present

## 2020-01-06 DIAGNOSIS — K589 Irritable bowel syndrome without diarrhea: Secondary | ICD-10-CM | POA: Diagnosis not present

## 2020-01-06 DIAGNOSIS — N1832 Chronic kidney disease, stage 3b: Secondary | ICD-10-CM | POA: Diagnosis not present

## 2020-01-06 DIAGNOSIS — K760 Fatty (change of) liver, not elsewhere classified: Secondary | ICD-10-CM | POA: Diagnosis not present

## 2020-01-06 DIAGNOSIS — K219 Gastro-esophageal reflux disease without esophagitis: Secondary | ICD-10-CM | POA: Diagnosis not present

## 2020-01-08 DIAGNOSIS — R82998 Other abnormal findings in urine: Secondary | ICD-10-CM | POA: Diagnosis not present

## 2020-01-08 DIAGNOSIS — I10 Essential (primary) hypertension: Secondary | ICD-10-CM | POA: Diagnosis not present

## 2020-01-21 ENCOUNTER — Ambulatory Visit: Payer: PPO | Attending: Internal Medicine

## 2020-01-21 DIAGNOSIS — Z23 Encounter for immunization: Secondary | ICD-10-CM | POA: Insufficient documentation

## 2020-01-21 NOTE — Progress Notes (Signed)
   Covid-19 Vaccination Clinic  Name:  Katherine Rush    MRN: [id number] DOB: 12-27-1945  01/21/2020  Katherine Rush was observed post Covid-19 immunization for 15 minutes without incidence. She was provided with Vaccine Information Sheet and instruction to access the V-Safe system.   Katherine Rush was instructed to call 911 with any severe reactions post vaccine: Marland Kitchen Difficulty breathing  . Swelling of your face and throat  . A fast heartbeat  . A bad rash all over your body  . Dizziness and weakness

## 2020-02-03 ENCOUNTER — Encounter: Payer: Self-pay | Admitting: Nurse Practitioner

## 2020-02-03 ENCOUNTER — Other Ambulatory Visit: Payer: Self-pay

## 2020-02-03 ENCOUNTER — Ambulatory Visit: Payer: PPO | Admitting: Nurse Practitioner

## 2020-02-03 VITALS — BP 122/62 | HR 82 | Temp 98.6°F | Ht 64.0 in | Wt 192.0 lb

## 2020-02-03 DIAGNOSIS — R195 Other fecal abnormalities: Secondary | ICD-10-CM

## 2020-02-03 DIAGNOSIS — Z86010 Personal history of colonic polyps: Secondary | ICD-10-CM

## 2020-02-03 MED ORDER — NA SULFATE-K SULFATE-MG SULF 17.5-3.13-1.6 GM/177ML PO SOLN: ORAL | 0 refills | Status: DC

## 2020-02-03 NOTE — Progress Notes (Signed)
Referring Provider: Nada Libman, MD  Reason for Referral : Positive Hemosure             ASSESSMENT / PLAN:   Katherine Rush is a 74 y.o. female with a pmh significant for, but not necessarily limited to, hypertension, CKD 3, IBS, spinal stenosis of lumbar region, GERD  # Positive Hemosure.  -No overt GI bleeding.  Will schedule for colonoscopy.  She is due now for a 5-year polyp surveillance colonoscopy anyway.   # History of adenomatous /sessile serrated polyps.   -Due for 5-year surveillance colonoscopy now. The risks and benefits of colonoscopy with possible polypectomy / biopsies were discussed and the patient agrees to proceed.    HPI:     Chief Complaint:  None.    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  She recently had a physical with PCP, Hemosure ordered and returned positive.  Patient went for a repeat Hemosure and it was positive as well.  No overt bleeding.  She has alternating constipation and diarrhea but this is not unsual for her.  She carries a diagnosis of IBS.  No abdominal pain.  She has recently developed some low back pain, different than any previous back pain.   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  The lower back pain gets better after a bowel movement. No urinary symptoms  Data Reviewed:  Feb 2016 polyp surveillance colonoscopy Melanosis coli was found throughout the entire examined colon 2. Five sessile polyps ranging between 3-15mm in size were found in the ascending colon, at the cecum, in the sigmoid colon, and descending colon; polypectomies were performed with a cold snare  TUBULAR ADENOMAS (X2); NEGATIVE FOR HIGH GRADE DYSPLASIA OR MALIGNANCY. - SESSILE SERRATED POLYP (X1); NEGATIVE FOR CYTOLOGIC DYSPLASIA. - HYPERPLASTIC POLYP (X1). - BENIGN POLYPOID COLONIC MUCOSA (X1).  Past Medical History:    Diagnosis Date  . Allergic rhinitis   . Gastroesophageal reflux disease   . History of colon polyps   . Hyperlipidemia   . Hypertension   . IBS (irritable bowel syndrome)   . Non-alcoholic fatty liver disease   . Spinal stenosis of lumbar region      Past Surgical History:  Procedure Laterality Date  . APPENDECTOMY  1960  . BREAST BIOPSY  1985   right  . CATARACT EXTRACTION  07/2011  . TOTAL ABDOMINAL HYSTERECTOMY W/ BILATERAL SALPINGOOPHORECTOMY  1990   Family History  Problem Relation Age of Onset  . Diabetes Mother   . Stroke Mother   . Heart attack Mother   . Heart attack Father   . Cancer Maternal Aunt        ? ovarian or cervical  . Breast cancer Maternal Aunt   . Throat cancer Cousin        1st cousin  . Breast cancer Cousin        1st cousin  . Stroke Maternal Grandmother   . Heart attack Maternal Grandfather   . Other Other        Majority of mother's side with heart disease  . Colon cancer Neg Hx    Social History   Tobacco Use  . Smoking status: Never Smoker  . Smokeless tobacco: Never Used  Substance Use Topics  . Alcohol use: No    Alcohol/week: 0.0 standard drinks  . Drug  use: No   Current Outpatient Medications  Medication Sig Dispense Refill  . acetaminophen (TYLENOL) 500 MG tablet Take 500 mg by mouth every 6 (six) hours as needed.    . AMBULATORY NON FORMULARY MEDICATION Cura Med 1,000mg - twice daily    . Calcium Carbonate (CALCIUM 600 PO) Take 1 tablet by mouth daily.    . diclofenac (VOLTAREN) 75 MG EC tablet Take 75 mg by mouth 2 (two) times daily.    . famotidine (PEPCID) 20 MG tablet Take 20 mg by mouth daily.    Marland Kitchen ibuprofen (ADVIL,MOTRIN) 200 MG tablet Take 200 mg by mouth every 6 (six) hours as needed.    Marland Kitchen olmesartan-hydrochlorothiazide (BENICAR HCT) 40-25 MG per tablet Take 1 tablet by mouth daily.     No current facility-administered medications for this visit.   Allergies  Allergen Reactions  . Iodine Rash  . Ivp Dye  [Iodinated Diagnostic Agents] Rash  . Penicillins Rash  . Sulfonamide Derivatives Rash     Review of Systems: All systems reviewed and negative except where noted in HPI.   Creatinine clearance cannot be calculated (Patient's most recent lab result is older than the maximum 21 days allowed.)   Physical Exam:    Wt Readings from Last 3 Encounters:  02/03/20 192 lb (87.1 kg)  01/27/15 195 lb (88.5 kg)  01/13/15 195 lb 9.6 oz (88.7 kg)    BP 122/62   Pulse 82   Temp 98.6 F (37 C)   Ht 5\' 4"  (1.626 m)   Wt 192 lb (87.1 kg)   SpO2 98%   BMI 32.96 kg/m  Constitutional:  Pleasant female in no acute distress. Psychiatric: Normal mood and affect. Behavior is normal. EENT: Pupils normal.  Conjunctivae are normal. No scleral icterus. Neck supple.  Cardiovascular: Normal rate, regular rhythm. No edema Pulmonary/chest: Effort normal and breath sounds normal. No wheezing, rales or rhonchi. Abdominal: Done with patient standing. Didn't climb on exam table due to back pain. Abd soft, nondistended, nontender. Bowel sounds active throughout. There are no masses palpable.  Neurological: Alert and oriented to person place and time. Skin: Skin is warm and dry. No rashes noted.  Tye Savoy, NP  02/03/2020, 10:53 AM  Cc:  Referring Provider Burnard Bunting, MD

## 2020-02-03 NOTE — Patient Instructions (Signed)
If you are age 74 or older, your body mass index should be between 23-30. Your Body mass index is 32.96 kg/m. If this is out of the aforementioned range listed, please consider follow up with your Primary Care Provider.  If you are age 61 or younger, your body mass index should be between 19-25. Your Body mass index is 32.96 kg/m. If this is out of the aformentioned range listed, please consider follow up with your Primary Care Provider.   You have been scheduled for a colonoscopy. Please follow written instructions given to you at your visit today.  Please pick up your prep supplies at the pharmacy within the next 1-3 days. If you use inhalers (even only as needed), please bring them with you on the day of your procedure. Your physician has requested that you go to www.startemmi.com and enter the access code given to you at your visit today. This web site gives a general overview about your procedure. However, you should still follow specific instructions given to you by our office regarding your preparation for the procedure.  We have sent the following medications to your pharmacy for you to pick up at your convenience: Suprep  Thank you for choosing me and [facility].   Tye Savoy, NP

## 2020-02-13 ENCOUNTER — Ambulatory Visit: Payer: PPO | Attending: Internal Medicine

## 2020-02-13 DIAGNOSIS — Z23 Encounter for immunization: Secondary | ICD-10-CM | POA: Insufficient documentation

## 2020-02-13 NOTE — Progress Notes (Signed)
   Covid-19 Vaccination Clinic  Name:  Katherine Rush    MRN: [id number] DOB: 09/21/1946  02/13/2020  Katherine Rush was observed post Covid-19 immunization for 15 minutes without incident. She was provided with Vaccine Information Sheet and instruction to access the V-Safe system.   Katherine Rush was instructed to call 911 with any severe reactions post vaccine: Marland Kitchen Difficulty breathing  . Swelling of face and throat  . A fast heartbeat  . A bad rash all over body  . Dizziness and weakness   Immunizations Administered    Name Date Dose VIS Date Route   Pfizer COVID-19 Vaccine 02/13/2020 12:22 PM 0.3 mL 11/20/2019 Intramuscular   Manufacturer: [place]   Lot: [id number]   [place]: [id number]

## 2020-02-16 NOTE — Progress Notes (Signed)
Addendum: Reviewed and agree with assessment and management plan. Tomekia Helton M, MD  

## 2020-03-10 ENCOUNTER — Encounter: Payer: Self-pay | Admitting: Internal Medicine

## 2020-03-18 ENCOUNTER — Ambulatory Visit (AMBULATORY_SURGERY_CENTER): Payer: PPO | Admitting: Internal Medicine

## 2020-03-18 ENCOUNTER — Other Ambulatory Visit: Payer: Self-pay

## 2020-03-18 ENCOUNTER — Other Ambulatory Visit: Payer: Self-pay | Admitting: Internal Medicine

## 2020-03-18 ENCOUNTER — Encounter: Payer: Self-pay | Admitting: Internal Medicine

## 2020-03-18 VITALS — BP 120/57 | HR 77 | Temp 97.3°F | Resp 17 | Ht 64.0 in | Wt 192.0 lb

## 2020-03-18 DIAGNOSIS — D125 Benign neoplasm of sigmoid colon: Secondary | ICD-10-CM

## 2020-03-18 DIAGNOSIS — R195 Other fecal abnormalities: Secondary | ICD-10-CM | POA: Diagnosis not present

## 2020-03-18 MED ORDER — SODIUM CHLORIDE 0.9 % IV SOLN: 500.0000 mL | Freq: Once | INTRAVENOUS | Status: DC

## 2020-03-18 NOTE — Progress Notes (Signed)
Called to room to assist during endoscopic procedure.  Patient ID and intended procedure confirmed with present staff. Received instructions for my participation in the procedure from the performing physician.  

## 2020-03-18 NOTE — Progress Notes (Signed)
PT taken to PACU. Monitors in place. VSS. Report given to RN. 

## 2020-03-18 NOTE — Op Note (Signed)
[facility] Patient Name: Katherine Rush Procedure Date: 03/18/2020 1:34 PM MRN: [id number] Endoscopist: Jerene Bears , MD Age: 74 Referring MD:  Date of Birth: 1946/12/04 Gender: Female Account #: [id number] Procedure:                Colonoscopy Indications:              Personal history of colonic polyps (sessile                            serrated polyp and tubular adenoma at last                            colonoscopy Feb 2016), Positive fecal                            immunochemical test Medicines:                Monitored Anesthesia Care Procedure:                Pre-Anesthesia Assessment:                           - Prior to the procedure, a History and Physical                            was performed, and patient medications and                            allergies were reviewed. The patient's tolerance of                            previous anesthesia was also reviewed. The risks                            and benefits of the procedure and the sedation                            options and risks were discussed with the patient.                            All questions were answered, and informed consent                            was obtained. Prior Anticoagulants: The patient has                            taken no previous anticoagulant or antiplatelet                            agents except for aspirin. ASA Grade Assessment: II                            - A patient with mild systemic disease. After  reviewing the risks and benefits, the patient was                            deemed in satisfactory condition to undergo the                            procedure.                           After obtaining informed consent, the colonoscope                            was passed under direct vision. Throughout the                            procedure, the patient's blood pressure, pulse, and                            oxygen saturations  were monitored continuously. The                            Colonoscope was introduced through the anus and                            advanced to the terminal ileum. The colonoscopy was                            performed without difficulty. The patient tolerated                            the procedure well. The quality of the bowel                            preparation was excellent. The terminal ileum,                            ileocecal valve, appendiceal orifice, and rectum                            were photographed. Scope In: 1:41:06 PM Scope Out: 2:00:09 PM Scope Withdrawal Time: 0 hours 13 minutes 42 seconds  Total Procedure Duration: 0 hours 19 minutes 3 seconds  Findings:                 The digital rectal exam was normal.                           The terminal ileum appeared normal.                           A 7 mm polyp was found in the sigmoid colon. The                            polyp was pedunculated. The polyp was removed with  a cold snare. Resection and retrieval were complete.                           Internal hemorrhoids were found during retroflexion. Complications:            No immediate complications. Estimated Blood Loss:     Estimated blood loss was minimal. Impression:               - The examined portion of the ileum was normal.                           - One 7 mm polyp in the sigmoid colon, removed with                            a cold snare. Resected and retrieved.                           - Internal hemorrhoids. Recommendation:           - Patient has a contact number available for                            emergencies. The signs and symptoms of potential                            delayed complications were discussed with the                            patient. Return to normal activities tomorrow.                            Written discharge instructions were provided to the                            patient.                            - Resume previous diet.                           - Continue present medications.                           - Await pathology results.                           - Repeat colonoscopy is recommended for                            surveillance. The colonoscopy date will be                            determined after pathology results from today's                            exam become available for review. Jerene Bears, MD 03/18/2020 2:03:54 PM This  report has been signed electronically.

## 2020-03-18 NOTE — Patient Instructions (Signed)
Information on polyps and hemorrhoids given to you today. ° °Await pathology results. ° °YOU HAD AN ENDOSCOPIC PROCEDURE TODAY AT THE [facility]:   Refer to the procedure report that was given to you for any specific questions about what was found during the examination.  If the procedure report does not answer your questions, please call your gastroenterologist to clarify.  If you requested that your care partner not be given the details of your procedure findings, then the procedure report has been included in a sealed envelope for you to review at your convenience later. ° °YOU SHOULD EXPECT: Some feelings of bloating in the abdomen. Passage of more gas than usual.  Walking can help get rid of the air that was put into your GI tract during the procedure and reduce the bloating. If you had a lower endoscopy (such as a colonoscopy or flexible sigmoidoscopy) you may notice spotting of blood in your stool or on the toilet paper. If you underwent a bowel prep for your procedure, you may not have a normal bowel movement for a few days. ° °Please Note:  You might notice some irritation and congestion in your nose or some drainage.  This is from the oxygen used during your procedure.  There is no need for concern and it should clear up in a day or so. ° °SYMPTOMS TO REPORT IMMEDIATELY: ° °· Following lower endoscopy (colonoscopy or flexible sigmoidoscopy): ° Excessive amounts of blood in the stool ° Significant tenderness or worsening of abdominal pains ° Swelling of the abdomen that is new, acute ° Fever of 100°F or higher ° ° °For urgent or emergent issues, a gastroenterologist can be reached at any hour by calling [phone]. °Do not use MyChart messaging for urgent concerns.  ° ° °DIET:  We do recommend a small meal at first, but then you may proceed to your regular diet.  Drink plenty of fluids but you should avoid alcoholic beverages for 24 hours. ° °ACTIVITY:  You should plan to take it easy for  the rest of today and you should NOT DRIVE or use heavy machinery until tomorrow (because of the sedation medicines used during the test).   ° °FOLLOW UP: °Our staff will call the number listed on your records 48-72 hours following your procedure to check on you and address any questions or concerns that you may have regarding the information given to you following your procedure. If we do not reach you, we will leave a message.  We will attempt to reach you two times.  During this call, we will ask if you have developed any symptoms of COVID 19. If you develop any symptoms (ie: fever, flu-like symptoms, shortness of breath, cough etc.) before then, please call [phone].  If you test positive for Covid 19 in the 2 weeks post procedure, please call and report this information to us.   ° °If any biopsies were taken you will be contacted by phone or by letter within the next 1-3 weeks.  Please call us at [phone] if you have not heard about the biopsies in 3 weeks.  ° ° °SIGNATURES/CONFIDENTIALITY: °You and/or your care partner have signed paperwork which will be entered into your electronic medical record.  These signatures attest to the fact that that the information above on your After Visit Summary has been reviewed and is understood.  Full responsibility of the confidentiality of this discharge information lies with you and/or your care-partner. °

## 2020-03-18 NOTE — Progress Notes (Signed)
Pt's states no medical or surgical changes since previsit or office visit. 

## 2020-03-21 ENCOUNTER — Other Ambulatory Visit (HOSPITAL_COMMUNITY): Payer: Self-pay | Admitting: Internal Medicine

## 2020-03-22 ENCOUNTER — Telehealth: Payer: Self-pay

## 2020-03-22 NOTE — Telephone Encounter (Signed)
Left message on answering machine. 

## 2020-03-22 NOTE — Telephone Encounter (Signed)
  Follow up Call-  Call back number 03/18/2020  Post procedure Call Back phone  # [phone]  Permission to leave phone message Yes  Some recent data might be hidden     Patient questions:  Do you have a fever, pain , or abdominal swelling? No. Pain Score  0 *  Have you tolerated food without any problems? Yes.    Have you been able to return to your normal activities? Yes.    Do you have any questions about your discharge instructions: Diet   No. Medications  No. Follow up visit  No.  Do you have questions or concerns about your Care? No.  Actions: * If pain score is 4 or above: No action needed, pain <4.  1. Have you developed a fever since your procedure? no  2.   Have you had an respiratory symptoms (SOB or cough) since your procedure? no  3.   Have you tested positive for COVID 19 since your procedure no  4.   Have you had any family members/close contacts diagnosed with the COVID 19 since your procedure?  no   If yes to any of these questions please route to Joylene John, RN and Erenest Rasher, RN

## 2020-03-23 ENCOUNTER — Encounter: Payer: Self-pay | Admitting: Internal Medicine

## 2020-08-08 DIAGNOSIS — K219 Gastro-esophageal reflux disease without esophagitis: Secondary | ICD-10-CM | POA: Diagnosis not present

## 2020-08-08 DIAGNOSIS — R112 Nausea with vomiting, unspecified: Secondary | ICD-10-CM | POA: Diagnosis not present

## 2020-08-08 DIAGNOSIS — N1832 Chronic kidney disease, stage 3b: Secondary | ICD-10-CM | POA: Diagnosis not present

## 2020-08-08 DIAGNOSIS — K589 Irritable bowel syndrome without diarrhea: Secondary | ICD-10-CM | POA: Diagnosis not present

## 2020-08-08 DIAGNOSIS — I129 Hypertensive chronic kidney disease with stage 1 through stage 4 chronic kidney disease, or unspecified chronic kidney disease: Secondary | ICD-10-CM | POA: Diagnosis not present

## 2020-08-08 DIAGNOSIS — Z1152 Encounter for screening for COVID-19: Secondary | ICD-10-CM | POA: Diagnosis not present

## 2020-08-17 DIAGNOSIS — K589 Irritable bowel syndrome without diarrhea: Secondary | ICD-10-CM | POA: Diagnosis not present

## 2020-08-17 DIAGNOSIS — R531 Weakness: Secondary | ICD-10-CM | POA: Diagnosis not present

## 2020-08-17 DIAGNOSIS — R112 Nausea with vomiting, unspecified: Secondary | ICD-10-CM | POA: Diagnosis not present

## 2020-08-17 DIAGNOSIS — R140 Abdominal distension (gaseous): Secondary | ICD-10-CM | POA: Diagnosis not present

## 2020-08-17 DIAGNOSIS — K219 Gastro-esophageal reflux disease without esophagitis: Secondary | ICD-10-CM | POA: Diagnosis not present

## 2020-08-18 ENCOUNTER — Other Ambulatory Visit: Payer: Self-pay | Admitting: Internal Medicine

## 2020-08-18 DIAGNOSIS — R110 Nausea: Secondary | ICD-10-CM

## 2020-08-18 DIAGNOSIS — R140 Abdominal distension (gaseous): Secondary | ICD-10-CM

## 2020-08-19 ENCOUNTER — Other Ambulatory Visit: Payer: Self-pay

## 2020-08-19 ENCOUNTER — Ambulatory Visit (HOSPITAL_COMMUNITY)
Admission: RE | Admit: 2020-08-19 | Discharge: 2020-08-19 | Disposition: A | Discharge status: Home / Self Care | Payer: PPO | Source: Ambulatory Visit | Attending: Internal Medicine | Admitting: Internal Medicine

## 2020-08-19 DIAGNOSIS — K8689 Other specified diseases of pancreas: Secondary | ICD-10-CM | POA: Diagnosis not present

## 2020-08-19 DIAGNOSIS — R140 Abdominal distension (gaseous): Secondary | ICD-10-CM | POA: Diagnosis not present

## 2020-08-19 DIAGNOSIS — K861 Other chronic pancreatitis: Secondary | ICD-10-CM | POA: Diagnosis not present

## 2020-08-19 DIAGNOSIS — R109 Unspecified abdominal pain: Secondary | ICD-10-CM | POA: Diagnosis not present

## 2020-08-19 DIAGNOSIS — R110 Nausea: Secondary | ICD-10-CM | POA: Diagnosis not present

## 2020-08-19 DIAGNOSIS — K760 Fatty (change of) liver, not elsewhere classified: Secondary | ICD-10-CM | POA: Diagnosis not present

## 2020-08-31 ENCOUNTER — Encounter: Payer: Self-pay | Admitting: Nurse Practitioner

## 2020-08-31 ENCOUNTER — Ambulatory Visit: Payer: PPO | Admitting: Nurse Practitioner

## 2020-08-31 VITALS — BP 122/68 | HR 94 | Ht 62.0 in | Wt 202.0 lb

## 2020-08-31 DIAGNOSIS — K760 Fatty (change of) liver, not elsewhere classified: Secondary | ICD-10-CM

## 2020-08-31 DIAGNOSIS — R935 Abnormal findings on diagnostic imaging of other abdominal regions, including retroperitoneum: Secondary | ICD-10-CM

## 2020-08-31 DIAGNOSIS — K861 Other chronic pancreatitis: Secondary | ICD-10-CM | POA: Diagnosis not present

## 2020-08-31 DIAGNOSIS — R112 Nausea with vomiting, unspecified: Secondary | ICD-10-CM | POA: Diagnosis not present

## 2020-08-31 NOTE — Progress Notes (Signed)
ASSESSMENT AND PLAN    # Nausea / vomiting / lower abdominal pain and weight loss. Resolved --Symptoms started suddenly in middle of the night on 8/29. Nausea lingered for a couple of weeks.  --Feeling better. She has started to liberalize diet, recover some of her weight. --Labs, non-contrast CT scan negative for acute abnormalities --No need for further GI workup --Patient will call office for any recurrent symptoms.   # Fatty liver disease, slight nodular contour of liver on CT scan raising concern for possible cirrhosis.  --No evidence for portal HTN on scan.  --will consider [place] elastography in future.  --We discussed fatty liver. We discussed weight loss, low carb diet  # Chronic calcific pancreatitis on non-contrast CT scan --This is surprising. Patient denies any history of pancreatitis or even episodes of abdominal pain which could have been undiagnosed pancreatitis. --No history of Etoh use. .  --At some point she may warrant more advanced imaging of the pancreas   HISTORY OF PRESENT ILLNESS     Primary Gastroenterologist : Zenovia Jarred, MD  Chief Complaint : nausea / vomiting ( resolved)  Katherine Rush is a 74 y.o. 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 Saw PCP the following day, got COVID test which was negative. Advised to take Zofran, went on a bland diet.  She took the Zofran which helped but nausea kept recurring for a couple of weeks though she had no further vomiting.  Because she had lower abdominal cramping and increased frequency of bowel movements patient never did have any diarrhea associated with the nausea/vomiting .  Temp max was 99.6.  Patient had a follow-up appointment with her PCP on 08/17/2020, she was still having ongoing nausea.  She  had lost 10 pounds, felt weak.  Her CBC on that day was normal.  CMP showed BUN of 30, creatinine 1.7, ALT 57, remainder of LFTs normal.  CT scan of the abdomen and pelvis was ordered, done without IV contrast due to allergy.  No acute findings.  The nausea and lower abdominal pain have slowly been improving.  She has been able to liberalize her diet and recover some of the lost weight. On Thursday, 3 days prior to onset of symptoms patient had eaten out with a group of people who subsequently developed diarrhea but no nausea/vomiting like she did.    Patient offers that her Mother recently passed away and she has had some significant stressors.  She wonders if some of her symptoms may have been emotionally driven.    Data Reviewed: 08/19/2020 CT scan abdomen and pelvis without IV contrast --No acute findings.   --Questionable developing left adrenal nodule.   --Hepatic steatosis with slight nodular contour of the liver raising concern for cirrhosis.   --Chronic calcific pancreatitis  Previous Endoscopic Evaluations / Pertinent Studies:   Past Medical History:  Diagnosis Date   Allergic rhinitis    Gastroesophageal reflux disease    History of colon polyps    Hyperlipidemia    Hypertension    IBS (irritable bowel syndrome)    Non-alcoholic fatty liver disease    Spinal stenosis of lumbar region     Current Medications, Allergies, Past Surgical History, Family History and Social History were reviewed in [employer] record.   Review of  Systems: No chest pain. No shortness of breath. No urinary complaints.   PHYSICAL EXAM :    Wt Readings from Last 3 Encounters:  08/31/20 202 lb (91.6 kg)  03/18/20 192 lb (87.1 kg)  02/03/20 192 lb (87.1 kg)    BP 122/68    Pulse 94    Ht 5\' 2"  (1.575 m) Comment: measured with flat shoes on   Wt 202 lb (91.6 kg)    BMI 36.95 kg/m  Constitutional:  Pleasant  female in no acute distress. Psychiatric: Normal mood  and affect. Behavior is normal. EENT: Pupils normal.  Conjunctivae are normal. No scleral icterus. Neck supple.  Cardiovascular: Normal rate, regular rhythm. No edema Pulmonary/chest: Effort normal and breath sounds normal. No wheezing, rales or rhonchi. Abdominal: Soft, nondistended, nontender. Bowel sounds active throughout. There are no masses palpable. No hepatomegaly. Neurological: Alert and oriented to person place and time. Skin: Skin is warm and dry. No rashes noted.  Tye Savoy, NP  08/31/2020, 3:22 PM  Cc:   Burnard Bunting, MD

## 2020-08-31 NOTE — Patient Instructions (Signed)
If you are age 74 or older, your body mass index should be between 23-30. Your Body mass index is 36.95 kg/m. If this is out of the aforementioned range listed, please consider follow up with your Primary Care Provider.  If you are age 39 or younger, your body mass index should be between 19-25. Your Body mass index is 36.95 kg/m. If this is out of the aformentioned range listed, please consider follow up with your Primary Care Provider.   Please call to be scheduled for an EGD with Dr. Hilarie Fredrickson if you have any further nausea: [phone]   Thank you for entrusting me with your care and for choosing [facility], Tye Savoy, NP-C

## 2020-09-09 NOTE — Progress Notes (Signed)
Addendum: Reviewed and agree with assessment and management plan. Robert Sperl M, MD  

## 2020-09-17 DIAGNOSIS — Z23 Encounter for immunization: Secondary | ICD-10-CM | POA: Diagnosis not present

## 2020-10-19 ENCOUNTER — Ambulatory Visit: Payer: PPO | Attending: Internal Medicine

## 2020-10-19 ENCOUNTER — Other Ambulatory Visit (HOSPITAL_COMMUNITY): Payer: Self-pay | Admitting: Internal Medicine

## 2020-10-19 DIAGNOSIS — Z6832 Body mass index (BMI) 32.0-32.9, adult: Secondary | ICD-10-CM | POA: Diagnosis not present

## 2020-10-19 DIAGNOSIS — N1832 Chronic kidney disease, stage 3b: Secondary | ICD-10-CM | POA: Diagnosis not present

## 2020-10-19 DIAGNOSIS — Z23 Encounter for immunization: Secondary | ICD-10-CM

## 2020-10-19 DIAGNOSIS — E6609 Other obesity due to excess calories: Secondary | ICD-10-CM | POA: Diagnosis not present

## 2020-10-19 DIAGNOSIS — I129 Hypertensive chronic kidney disease with stage 1 through stage 4 chronic kidney disease, or unspecified chronic kidney disease: Secondary | ICD-10-CM | POA: Diagnosis not present

## 2020-11-01 ENCOUNTER — Other Ambulatory Visit: Payer: Self-pay

## 2020-11-01 ENCOUNTER — Ambulatory Visit: Payer: PPO | Admitting: Cardiovascular Disease

## 2020-11-01 ENCOUNTER — Other Ambulatory Visit: Payer: Self-pay | Admitting: Cardiovascular Disease

## 2020-11-01 ENCOUNTER — Encounter: Payer: Self-pay | Admitting: Cardiovascular Disease

## 2020-11-01 VITALS — BP 138/68 | HR 100 | Ht 62.0 in | Wt 208.6 lb

## 2020-11-01 DIAGNOSIS — I10 Essential (primary) hypertension: Secondary | ICD-10-CM

## 2020-11-01 DIAGNOSIS — R000 Tachycardia, unspecified: Secondary | ICD-10-CM | POA: Diagnosis not present

## 2020-11-01 DIAGNOSIS — R252 Cramp and spasm: Secondary | ICD-10-CM | POA: Diagnosis not present

## 2020-11-01 DIAGNOSIS — R600 Localized edema: Secondary | ICD-10-CM | POA: Insufficient documentation

## 2020-11-01 MED ORDER — CARVEDILOL 6.25 MG PO TABS: 6.2500 mg | ORAL_TABLET | Freq: Two times a day (BID) | ORAL | 3 refills | Status: DC

## 2020-11-01 MED ORDER — CHLORTHALIDONE 25 MG PO TABS: 12.5000 mg | ORAL_TABLET | Freq: Every day | ORAL | 3 refills | Status: DC

## 2020-11-01 NOTE — Patient Instructions (Addendum)
Medication Instructions:  1) DECREASE CHLORTHALIDONE to 12.5 mg daily 2) START CARVEDILOL 6.25 mg twice daily *If you need a refill on your cardiac medications before your next appointment, please call your pharmacy*  Lab Work: TODAY! BMET If you have labs (blood work) drawn today and your tests are completely normal, you will receive your results only by: Marland Kitchen MyChart Message (if you have MyChart) OR . A paper copy in the mail If you have any lab test that is abnormal or we need to change your treatment, we will call you to review the results.  Testing/Procedures: Your provider has requested that you have an echocardiogram. Echocardiography is a painless test that uses sound waves to create images of your heart. It provides your doctor with information about the size and shape of your heart and how well your heart's chambers and valves are working. This procedure takes approximately one hour. There are no restrictions for this procedure.  Follow-Up: At [facility], you and your health needs are our priority.  As part of our continuing mission to provide you with exceptional heart care, we have created designated Provider Care Teams.  These Care Teams include your primary Cardiologist (physician) and Advanced Practice Providers (APPs -  Physician Assistants and Nurse Practitioners) who all work together to provide you with the care you need, when you need it. Your next appointment:   3 month(s) The format for your next appointment:   In Person Provider:   You may see Dr. Acie Fredrickson or one of the following Advanced Practice Providers on your designated Care Team:    Richardson Dopp, PA-C  Vin Ulmer, [place]    For your leg edema you should do the following 1. Leg elevation - I recommend the Lounge Dr. Leg rest.  See below for details  2. Salt restriction  -  Use potassium chloride instead of regular salt as a salt substitute. 3. Walk regularly 4. Compression hose - [facility] [employer] 5.  Weight loss    Available on [email or web address] Or  Go to [email or web address]

## 2020-11-01 NOTE — Addendum Note (Signed)
Addended by: Harland German A on: 11/01/2020 03:55 PM   Modules accepted: Orders

## 2020-11-01 NOTE — Progress Notes (Addendum)
Cardiology Office Note:    Date:  11/01/2020   ID:  Katherine Rush, DOB [birth year], MRN [id number]  PCP:  Burnard Bunting, MD  [facility] Cardiologist:  [facility] Electrophysiologist:  None   Referring MD: Toy Baker Da*   Chief Complaint  Patient presents with  . Hypertension    Nov. 23, 2021   Katherine Rush is a 74 y.o. female with a hx of HTN, HLD. We were asked to see her today by Dr. Reynaldo Minium for further evaluation and management of her HTN. She was recently seen by the NP at [facility]   Has been having some chest discomfort recently  Pressure,  Usually during the night when she is lying down  Sleeps in a recliner due to back issues + family hx of cardiac disease   Does not have CP with exertion, but does have DOE .    Eating some salt in her diet ,  Does not watch her diet since her mother passed away   Does not get any regular exercise ,  Has been caring for her mother who recently passed away after 4 years of full time care .    Past Medical History:  Diagnosis Date  . Allergic rhinitis   . Gastroesophageal reflux disease   . History of colon polyps   . Hyperlipidemia   . Hypertension   . IBS (irritable bowel syndrome)   . Non-alcoholic fatty liver disease   . Spinal stenosis of lumbar region     Past Surgical History:  Procedure Laterality Date  . APPENDECTOMY  1960  . BREAST BIOPSY  1985   right  . CATARACT EXTRACTION  07/2011  . TOTAL ABDOMINAL HYSTERECTOMY W/ BILATERAL SALPINGOOPHORECTOMY  1990    Current Medications: Current Meds  Medication Sig  . acetaminophen (TYLENOL) 500 MG tablet Take 500 mg by mouth every 6 (six) hours as needed.  . chlorthalidone (HYGROTON) 25 MG tablet Take 0.5 tablets (12.5 mg total) by mouth daily.  Marland Kitchen ibuprofen (ADVIL,MOTRIN) 200 MG tablet Take 200 mg by mouth every 6 (six) hours as needed.  Marland Kitchen olmesartan (BENICAR) 40 MG tablet Take 40 mg by mouth daily.  Marland Kitchen omeprazole  (PRILOSEC OTC) 20 MG tablet Take 20 mg by mouth daily.  . [DISCONTINUED] chlorthalidone (HYGROTON) 25 MG tablet Take 25 mg by mouth daily.  . [DISCONTINUED] diclofenac (VOLTAREN) 75 MG EC tablet Take 75 mg by mouth daily.   . [DISCONTINUED] olmesartan-hydrochlorothiazide (BENICAR HCT) 40-25 MG per tablet Take 0.5-1 tablets by mouth daily. 1/2 to 1 tablet daily     Allergies:   Iodine, Ivp dye [iodinated diagnostic agents], Penicillins, and Sulfonamide derivatives   Social History   Socioeconomic History  . Marital status: Single    Spouse name: Not on file  . Number of children: Not on file  . Years of education: Not on file  . Highest education level: Not on file  Occupational History  . Not on file  Tobacco Use  . Smoking status: Never Smoker  . Smokeless tobacco: Never Used  Vaping Use  . Vaping Use: Never used  Substance and Sexual Activity  . Alcohol use: No    Alcohol/week: 0.0 standard drinks  . Drug use: No  . Sexual activity: Not on file  Other Topics Concern  . Not on file  Social History Narrative  . Not on file   Social Determinants of Health   Financial Resource Strain:   . Difficulty of Paying Living  Expenses: Not on file  Food Insecurity:   . Worried About Charity fundraiser in the Last Year: Not on file  . Ran Out of Food in the Last Year: Not on file  Transportation Needs:   . Lack of Transportation (Medical): Not on file  . Lack of Transportation (Non-Medical): Not on file  Physical Activity:   . Days of Exercise per Week: Not on file  . Minutes of Exercise per Session: Not on file  Stress:   . Feeling of Stress : Not on file  Social Connections:   . Frequency of Communication with Friends and Family: Not on file  . Frequency of Social Gatherings with Friends and Family: Not on file  . Attends Religious Services: Not on file  . Active Member of Clubs or Organizations: Not on file  . Attends Archivist Meetings: Not on file  . Marital  Status: Not on file     Family History: The patient's family history includes Breast cancer in her cousin and maternal aunt; Cancer in her maternal aunt; Diabetes in her mother; Heart attack in her father, maternal grandfather, and mother; Other in an other family member; Stroke in her maternal grandmother and mother; Throat cancer in her cousin. There is no history of Colon cancer.  ROS:   Please see the history of present illness.     All other systems reviewed and are negative.  EKGs/Labs/Other Studies Reviewed:    The following studies were reviewed today:   EKG: November 01, 2020: Normal sinus rhythm at 100.  No ST or T wave abnormalities.  Recent Labs: No results found for requested labs within last 8760 hours.  Recent Lipid Panel No results found for: CHOL, TRIG, HDL, CHOLHDL, VLDL, LDLCALC, LDLDIRECT   Risk Assessment/Calculations:       Physical Exam:    VS:  BP 138/68   Pulse 100   Ht 5\' 2"  (1.575 m)   Wt 208 lb 9.6 oz (94.6 kg)   SpO2 98%   BMI 38.15 kg/m     Wt Readings from Last 3 Encounters:  11/01/20 208 lb 9.6 oz (94.6 kg)  08/31/20 202 lb (91.6 kg)  03/18/20 192 lb (87.1 kg)     GEN: elderly , moderately obese female  HEENT: Normal NECK: No JVD; No carotid bruits LYMPHATICS: No lymphadenopathy CARDIAC: RRR,  6-6/5 systolic murmur  RESPIRATORY:  Clear to auscultation without rales, wheezing or rhonchi  ABDOMEN: Soft, non-tender, non-distended MUSCULOSKELETAL:  No edema; No deformity  SKIN: Warm and dry NEUROLOGIC:  Alert and oriented x 3 PSYCHIATRIC:  Normal affect   ASSESSMENT:    1. Leg cramping   2. Tachycardia   3. Primary hypertension   4. Leg edema   5. Leg cramps    PLAN:    In order of problems listed above:  1. Hypertension: Her blood pressure is fairly well controlled at this point.  Unfortunately she is developed tachycardia and some leg cramps.  I suspect that she may be getting too much chlorthalidone.  We will reduce  the chlorthalidone to 12.5 mg a day.  We will add carvedilol 6.25 mg twice a day.  Advised her to work on her salt restriction.  I have also advised weight loss and some regular exercise.  We will check a basic metabolic profile today.  2.  Leg cramps: I suspect this might be due to overaggressive diuresis or perhaps low potassium levels.  We will check a basic metabolic profile  today.  3.  Obesity: I strongly advised her to work on weight loss.  She has chronic back pain and is not able to exercise much.  She might build to do a stationary bike.  4.  Leg edema: She is having significant leg edema.  I suspect this is due to her obesity, inactivity and the fact that she sleeps in a recliner at night.  I would like to do an echocardiogram for further evaluation. She will continue to work on these other issues.   Shared Decision Making/Informed Consent        Medication Adjustments/Labs and Tests Ordered: Current medicines are reviewed at length with the patient today.  Concerns regarding medicines are outlined above.  Orders Placed This Encounter  Procedures  . Basic metabolic panel  . EKG 12-Lead  . ECHOCARDIOGRAM COMPLETE   Meds ordered this encounter  Medications  . carvedilol (COREG) 6.25 MG tablet    Sig: Take 1 tablet (6.25 mg total) by mouth 2 (two) times daily.    Dispense:  180 tablet    Refill:  3  . chlorthalidone (HYGROTON) 25 MG tablet    Sig: Take 0.5 tablets (12.5 mg total) by mouth daily.    Dispense:  45 tablet    Refill:  3    Patient Instructions  Medication Instructions:  1) DECREASE CHLORTHALIDONE to 12.5 mg daily 2) START CARVEDILOL 6.25 mg twice daily *If you need a refill on your cardiac medications before your next appointment, please call your pharmacy*  Lab Work: TODAY! BMET If you have labs (blood work) drawn today and your tests are completely normal, you will receive your results only by: Marland Kitchen MyChart Message (if you have MyChart) OR . A paper  copy in the mail If you have any lab test that is abnormal or we need to change your treatment, we will call you to review the results.  Testing/Procedures: Your provider has requested that you have an echocardiogram. Echocardiography is a painless test that uses sound waves to create images of your heart. It provides your doctor with information about the size and shape of your heart and how well your heart's chambers and valves are working. This procedure takes approximately one hour. There are no restrictions for this procedure.  Follow-Up: At [facility], you and your health needs are our priority.  As part of our continuing mission to provide you with exceptional heart care, we have created designated Provider Care Teams.  These Care Teams include your primary Cardiologist (physician) and Advanced Practice Providers (APPs -  Physician Assistants and Nurse Practitioners) who all work together to provide you with the care you need, when you need it. Your next appointment:   3 month(s) The format for your next appointment:   In Person Provider:   You may see Dr. Acie Fredrickson or one of the following Advanced Practice Providers on your designated Care Team:    Richardson Dopp, PA-C  Vin McKinley, [place]    For your leg edema you should do the following 1. Leg elevation - I recommend the Lounge Dr. Leg rest.  See below for details  2. Salt restriction  -  Use potassium chloride instead of regular salt as a salt substitute. 3. Walk regularly 4. Compression hose - [facility] [employer] [facility] 5. Weight loss    Available on [email or web address] Or  Go to [email or web address]         Signed, Mertie Moores, MD  11/01/2020 4:05 PM    [place]

## 2020-11-02 LAB — BASIC METABOLIC PANEL
BUN/Creatinine Ratio: 21 (ref 12–28)
BUN: 34 mg/dL — ABNORMAL HIGH (ref 8–27)
CO2: 26 mmol/L (ref 20–29)
Calcium: 9.4 mg/dL (ref 8.7–10.3)
Chloride: 102 mmol/L (ref 96–106)
Creatinine, Ser: 1.61 mg/dL — ABNORMAL HIGH (ref 0.57–1.00)
GFR calc Af Amer: 36 mL/min/{1.73_m2} — ABNORMAL LOW (ref >59)
GFR calc non Af Amer: 31 mL/min/{1.73_m2} — ABNORMAL LOW (ref >59)
Glucose: 117 mg/dL — ABNORMAL HIGH (ref 65–99)
Potassium: 4.5 mmol/L (ref 3.5–5.2)
Sodium: 141 mmol/L (ref 134–144)

## 2020-11-07 ENCOUNTER — Telehealth: Payer: Self-pay | Admitting: Cardiovascular Disease

## 2020-11-07 NOTE — Telephone Encounter (Signed)
° ° °  Pt returning call to get lab results

## 2020-11-07 NOTE — Telephone Encounter (Signed)
Reviewed results with patient. She states she has reduced the chlorthalidone to 12.5 mg is going to Dr. Jacquiline Doe office on 12/8. I advised her to maintain good hydration and follow-up as directed. She verbalized understanding and agreement and thanked me for the call.

## 2020-11-14 ENCOUNTER — Other Ambulatory Visit: Payer: Self-pay

## 2020-11-14 ENCOUNTER — Ambulatory Visit (HOSPITAL_COMMUNITY): Payer: PPO | Attending: Cardiology

## 2020-11-14 DIAGNOSIS — I10 Essential (primary) hypertension: Secondary | ICD-10-CM | POA: Insufficient documentation

## 2020-11-14 DIAGNOSIS — R000 Tachycardia, unspecified: Secondary | ICD-10-CM | POA: Diagnosis not present

## 2020-11-14 DIAGNOSIS — R600 Localized edema: Secondary | ICD-10-CM | POA: Insufficient documentation

## 2020-11-14 DIAGNOSIS — R252 Cramp and spasm: Secondary | ICD-10-CM | POA: Diagnosis not present

## 2020-11-14 LAB — ECHOCARDIOGRAM COMPLETE
Area-P 1/2: 4.64 cm2
S' Lateral: 1.9 cm

## 2020-11-14 MED ORDER — PERFLUTREN LIPID MICROSPHERE
1.0000 mL | INTRAVENOUS | Status: AC | PRN
  Administered 2020-11-14: 3 mL via INTRAVENOUS

## 2020-11-16 ENCOUNTER — Other Ambulatory Visit (HOSPITAL_COMMUNITY): Payer: Self-pay | Admitting: Registered Nurse

## 2020-11-16 DIAGNOSIS — I129 Hypertensive chronic kidney disease with stage 1 through stage 4 chronic kidney disease, or unspecified chronic kidney disease: Secondary | ICD-10-CM | POA: Diagnosis not present

## 2020-11-16 DIAGNOSIS — R609 Edema, unspecified: Secondary | ICD-10-CM | POA: Diagnosis not present

## 2020-11-16 DIAGNOSIS — G8929 Other chronic pain: Secondary | ICD-10-CM | POA: Diagnosis not present

## 2020-11-16 DIAGNOSIS — N1832 Chronic kidney disease, stage 3b: Secondary | ICD-10-CM | POA: Diagnosis not present

## 2020-11-16 DIAGNOSIS — M5440 Lumbago with sciatica, unspecified side: Secondary | ICD-10-CM | POA: Diagnosis not present

## 2020-12-15 DIAGNOSIS — I10 Essential (primary) hypertension: Secondary | ICD-10-CM | POA: Diagnosis not present

## 2020-12-20 DIAGNOSIS — M5440 Lumbago with sciatica, unspecified side: Secondary | ICD-10-CM | POA: Diagnosis not present

## 2020-12-22 DIAGNOSIS — M5440 Lumbago with sciatica, unspecified side: Secondary | ICD-10-CM | POA: Diagnosis not present

## 2020-12-27 DIAGNOSIS — M5440 Lumbago with sciatica, unspecified side: Secondary | ICD-10-CM | POA: Diagnosis not present

## 2020-12-29 DIAGNOSIS — M5440 Lumbago with sciatica, unspecified side: Secondary | ICD-10-CM | POA: Diagnosis not present

## 2021-01-02 DIAGNOSIS — M5440 Lumbago with sciatica, unspecified side: Secondary | ICD-10-CM | POA: Diagnosis not present

## 2021-01-05 DIAGNOSIS — M5440 Lumbago with sciatica, unspecified side: Secondary | ICD-10-CM | POA: Diagnosis not present

## 2021-01-10 DIAGNOSIS — M5440 Lumbago with sciatica, unspecified side: Secondary | ICD-10-CM | POA: Diagnosis not present

## 2021-01-13 DIAGNOSIS — M5440 Lumbago with sciatica, unspecified side: Secondary | ICD-10-CM | POA: Diagnosis not present

## 2021-01-17 DIAGNOSIS — M5440 Lumbago with sciatica, unspecified side: Secondary | ICD-10-CM | POA: Diagnosis not present

## 2021-01-20 DIAGNOSIS — M5440 Lumbago with sciatica, unspecified side: Secondary | ICD-10-CM | POA: Diagnosis not present

## 2021-01-23 DIAGNOSIS — M5440 Lumbago with sciatica, unspecified side: Secondary | ICD-10-CM | POA: Diagnosis not present

## 2021-01-25 DIAGNOSIS — M5440 Lumbago with sciatica, unspecified side: Secondary | ICD-10-CM | POA: Diagnosis not present

## 2021-02-12 ENCOUNTER — Encounter: Payer: Self-pay | Admitting: Cardiovascular Disease

## 2021-02-12 NOTE — Progress Notes (Unsigned)
Cardiology Office Note:    Date:  02/13/2021   ID:  Katherine Rush, DOB Nov 10, 1946, MRN [id number]  PCP:  Burnard Bunting, MD  [facility] Cardiologist:  [facility] Electrophysiologist:  None   Referring MD: Burnard Bunting, MD   Chief Complaint  Patient presents with  . Hypertension    Nov. 23, 2021   Katherine Rush is a 75 y.o. female with a hx of HTN, HLD. We were asked to see her today by Dr. Reynaldo Minium for further evaluation and management of her HTN. She was recently seen by the NP at [facility]   Has been having some chest discomfort recently  Pressure,  Usually during the night when she is lying down  Sleeps in a recliner due to back issues + family hx of cardiac disease   Does not have CP with exertion, but does have DOE .    Eating some salt in her diet ,  Does not watch her diet since her mother passed away   Does not get any regular exercise ,  Has been caring for her mother who recently passed away after 4 years of full time care .   February 13, 2021;  Katherine Rush is a 75 yo with hx of HTN, morbid obesity, HLD . Chronic leg edema . Echo from Dec. 2021 shows normal LV systolic function witih grade  1 DD BP is better  Is on a diet .  But is gaining weight   She had her labs repeat at [facility]  - she did not get a call   She cannot access the patient portal so she does not know those results    Past Medical History:  Diagnosis Date  . Allergic rhinitis   . Gastroesophageal reflux disease   . History of colon polyps   . Hyperlipidemia   . Hypertension   . IBS (irritable bowel syndrome)   . Non-alcoholic fatty liver disease   . Spinal stenosis of lumbar region     Past Surgical History:  Procedure Laterality Date  . APPENDECTOMY  1960  . BREAST BIOPSY  1985   right  . CATARACT EXTRACTION  07/2011  . TOTAL ABDOMINAL HYSTERECTOMY W/ BILATERAL SALPINGOOPHORECTOMY  1990    Current Medications: Current Meds  Medication  Sig  . acetaminophen (TYLENOL) 500 MG tablet Take 500 mg by mouth every 6 (six) hours as needed.  . carvedilol (COREG) 6.25 MG tablet Take 1 tablet (6.25 mg total) by mouth 2 (two) times daily.  . chlorthalidone (HYGROTON) 25 MG tablet Take 0.5 tablets (12.5 mg total) by mouth daily.  Marland Kitchen ibuprofen (ADVIL,MOTRIN) 200 MG tablet Take 200 mg by mouth every 6 (six) hours as needed.  Marland Kitchen olmesartan (BENICAR) 40 MG tablet Take 40 mg by mouth daily.  Marland Kitchen omeprazole (PRILOSEC OTC) 20 MG tablet Take 20 mg by mouth daily.  . Probiotic Product (PROBIOTIC DAILY) CAPS Take 1 capsule by mouth daily.     Allergies:   Iodine, Ivp dye [iodinated diagnostic agents], Penicillins, and Sulfonamide derivatives   Social History   Socioeconomic History  . Marital status: Single    Spouse name: Not on file  . Number of children: Not on file  . Years of education: Not on file  . Highest education level: Not on file  Occupational History  . Not on file  Tobacco Use  . Smoking status: Never Smoker  . Smokeless tobacco: Never Used  Vaping Use  . Vaping Use: Never used  Substance and Sexual Activity  . Alcohol use: No    Alcohol/week: 0.0 standard drinks  . Drug use: No  . Sexual activity: Not on file  Other Topics Concern  . Not on file  Social History Narrative  . Not on file   Social Determinants of Health   Financial Resource Strain: Not on file  Food Insecurity: Not on file  Transportation Needs: Not on file  Physical Activity: Not on file  Stress: Not on file  Social Connections: Not on file     Family History: The patient's family history includes Breast cancer in her cousin and maternal aunt; Cancer in her maternal aunt; Diabetes in her mother; Heart attack in her father, maternal grandfather, and mother; Other in an other family member; Stroke in her maternal grandmother and mother; Throat cancer in her cousin. There is no history of Colon cancer.  ROS:   Please see the history of present  illness.     All other systems reviewed and are negative.  EKGs/Labs/Other Studies Reviewed:    The following studies were reviewed today:     Recent Labs: 11/01/2020: BUN 34; Creatinine, Ser 1.61; Potassium 4.5; Sodium 141  Recent Lipid Panel No results found for: CHOL, TRIG, HDL, CHOLHDL, VLDL, LDLCALC, LDLDIRECT   Risk Assessment/Calculations:       Physical Exam:    Physical Exam: Blood pressure 134/68, pulse 82, height 5\' 2"  (1.575 m), weight 219 lb (99.3 kg), SpO2 97 %.  GEN:  Well nourished, well developed in no acute distress HEENT: Normal NECK: No JVD; No carotid bruits LYMPHATICS: No lymphadenopathy CARDIAC: RRR , no murmurs, rubs, gallops RESPIRATORY:  Clear to auscultation without rales, wheezing or rhonchi  ABDOMEN: Soft, non-tender, non-distended MUSCULOSKELETAL:  No edema; No deformity  SKIN: Warm and dry NEUROLOGIC:  Alert and oriented x 3   EKG:   ASSESSMENT:    No diagnosis found. PLAN:    - DOE:   So far we have not been able to identify any specific cardiac etiology for her profound DOE.  She has grade 1 diastolic dysfunction.  Her blood pressure is better.  She has normal left ventricular systolic function.  She is not mentioned any signs or symptoms that I think are angina.  She does not want to pursue any further coronary artery disease work-up at this time.  Doing a coronary CT scan would require that she be able to lie flat.  She will continue to work on diet, exercise.  We will have her see an APP in 6 months..  Hypertension:  BP looks great    2.  Leg cramps:   3.  Obesity:   Continue to work on weight loss   4.  Leg edema:  .   Shared Decision Making/Informed Consent        Medication Adjustments/Labs and Tests Ordered: Current medicines are reviewed at length with the patient today.  Concerns regarding medicines are outlined above.  No orders of the defined types were placed in this encounter.  No orders of the defined  types were placed in this encounter.   Patient Instructions  Medication Instructions:  Your physician recommends that you continue on your current medications as directed. Please refer to the Current Medication list given to you today. *If you need a refill on your cardiac medications before your next appointment, please call your pharmacy*   Lab Work: none If you have labs (blood work) drawn today and your tests are completely normal, you  will receive your results only by: Marland Kitchen MyChart Message (if you have MyChart) OR . A paper copy in the mail If you have any lab test that is abnormal or we need to change your treatment, we will call you to review the results.   Testing/Procedures: none   Follow-Up: At [facility], you and your health needs are our priority.  As part of our continuing mission to provide you with exceptional heart care, we have created designated Provider Care Teams.  These Care Teams include your primary Cardiologist (physician) and Advanced Practice Providers (APPs -  Physician Assistants and Nurse Practitioners) who all work together to provide you with the care you need, when you need it.  We recommend signing up for the patient portal called "MyChart".  Sign up information is provided on this After Visit Summary.  MyChart is used to connect with patients for Virtual Visits (Telemedicine).  Patients are able to view lab/test results, encounter notes, upcoming appointments, etc.  Non-urgent messages can be sent to your provider as well.   To learn more about what you can do with MyChart, go to [email or web address].    Your next appointment:   6 month(s)  The format for your next appointment:   In Person  Provider:   You will see one of the following Advanced Practice Providers on your designated Care Team:    Richardson Dopp, PA-C  Robbie Lis, [place]       Signed, Mertie Moores, MD  02/13/2021 2:22 PM    [place]

## 2021-02-13 ENCOUNTER — Other Ambulatory Visit: Payer: Self-pay

## 2021-02-13 ENCOUNTER — Encounter: Payer: Self-pay | Admitting: Cardiovascular Disease

## 2021-02-13 ENCOUNTER — Ambulatory Visit: Payer: PPO | Admitting: Cardiovascular Disease

## 2021-02-13 VITALS — BP 134/68 | HR 82 | Ht 62.0 in | Wt 219.0 lb

## 2021-02-13 DIAGNOSIS — R0602 Shortness of breath: Secondary | ICD-10-CM | POA: Insufficient documentation

## 2021-02-13 DIAGNOSIS — I10 Essential (primary) hypertension: Secondary | ICD-10-CM

## 2021-02-13 DIAGNOSIS — R0609 Other forms of dyspnea: Secondary | ICD-10-CM

## 2021-02-13 DIAGNOSIS — R0600 Dyspnea, unspecified: Secondary | ICD-10-CM | POA: Insufficient documentation

## 2021-02-13 DIAGNOSIS — I5030 Unspecified diastolic (congestive) heart failure: Secondary | ICD-10-CM | POA: Insufficient documentation

## 2021-02-13 NOTE — Patient Instructions (Signed)
Medication Instructions:  Your physician recommends that you continue on your current medications as directed. Please refer to the Current Medication list given to you today. *If you need a refill on your cardiac medications before your next appointment, please call your pharmacy*   Lab Work: none If you have labs (blood work) drawn today and your tests are completely normal, you will receive your results only by: Marland Kitchen MyChart Message (if you have MyChart) OR . A paper copy in the mail If you have any lab test that is abnormal or we need to change your treatment, we will call you to review the results.   Testing/Procedures: none   Follow-Up: At [facility], you and your health needs are our priority.  As part of our continuing mission to provide you with exceptional heart care, we have created designated Provider Care Teams.  These Care Teams include your primary Cardiologist (physician) and Advanced Practice Providers (APPs -  Physician Assistants and Nurse Practitioners) who all work together to provide you with the care you need, when you need it.  We recommend signing up for the patient portal called "MyChart".  Sign up information is provided on this After Visit Summary.  MyChart is used to connect with patients for Virtual Visits (Telemedicine).  Patients are able to view lab/test results, encounter notes, upcoming appointments, etc.  Non-urgent messages can be sent to your provider as well.   To learn more about what you can do with MyChart, go to [email or web address].    Your next appointment:   6 month(s)  The format for your next appointment:   In Person  Provider:   You will see one of the following Advanced Practice Providers on your designated Care Team:    Richardson Dopp, PA-C  Vin [place], [place]

## 2021-03-16 ENCOUNTER — Other Ambulatory Visit (HOSPITAL_COMMUNITY): Payer: Self-pay

## 2021-03-16 MED FILL — Olmesartan Medoxomil Tab 40 MG: ORAL | 30 days supply | Qty: 30 | Fill #0 | Status: AC

## 2021-04-19 ENCOUNTER — Other Ambulatory Visit (HOSPITAL_COMMUNITY): Payer: Self-pay

## 2021-04-19 MED FILL — Olmesartan Medoxomil Tab 40 MG: ORAL | 30 days supply | Qty: 30 | Fill #1 | Status: AC

## 2021-05-16 ENCOUNTER — Other Ambulatory Visit (HOSPITAL_COMMUNITY): Payer: Self-pay

## 2021-05-16 MED FILL — Olmesartan Medoxomil Tab 40 MG: ORAL | 30 days supply | Qty: 30 | Fill #2 | Status: AC

## 2021-05-16 MED FILL — Carvedilol Tab 6.25 MG: ORAL | 90 days supply | Qty: 180 | Fill #0 | Status: AC

## 2021-06-06 ENCOUNTER — Other Ambulatory Visit (HOSPITAL_COMMUNITY): Payer: Self-pay

## 2021-06-06 MED FILL — Chlorthalidone Tab 25 MG: ORAL | 90 days supply | Qty: 45 | Fill #0 | Status: AC

## 2021-06-14 ENCOUNTER — Other Ambulatory Visit (HOSPITAL_COMMUNITY): Payer: Self-pay

## 2021-06-19 ENCOUNTER — Other Ambulatory Visit (HOSPITAL_COMMUNITY): Payer: Self-pay

## 2021-06-19 MED FILL — Olmesartan Medoxomil Tab 40 MG: ORAL | 30 days supply | Qty: 30 | Fill #3 | Status: AC

## 2021-06-22 DIAGNOSIS — Z1152 Encounter for screening for COVID-19: Secondary | ICD-10-CM | POA: Diagnosis not present

## 2021-06-22 DIAGNOSIS — R5383 Other fatigue: Secondary | ICD-10-CM | POA: Diagnosis not present

## 2021-06-22 DIAGNOSIS — J209 Acute bronchitis, unspecified: Secondary | ICD-10-CM | POA: Diagnosis not present

## 2021-06-22 DIAGNOSIS — J309 Allergic rhinitis, unspecified: Secondary | ICD-10-CM | POA: Diagnosis not present

## 2021-06-22 DIAGNOSIS — R0989 Other specified symptoms and signs involving the circulatory and respiratory systems: Secondary | ICD-10-CM | POA: Diagnosis not present

## 2021-06-22 DIAGNOSIS — J029 Acute pharyngitis, unspecified: Secondary | ICD-10-CM | POA: Diagnosis not present

## 2021-06-30 DIAGNOSIS — E785 Hyperlipidemia, unspecified: Secondary | ICD-10-CM | POA: Diagnosis not present

## 2021-07-05 DIAGNOSIS — E785 Hyperlipidemia, unspecified: Secondary | ICD-10-CM | POA: Diagnosis not present

## 2021-07-05 DIAGNOSIS — R82998 Other abnormal findings in urine: Secondary | ICD-10-CM | POA: Diagnosis not present

## 2021-07-05 DIAGNOSIS — Z1331 Encounter for screening for depression: Secondary | ICD-10-CM | POA: Diagnosis not present

## 2021-07-05 DIAGNOSIS — N1832 Chronic kidney disease, stage 3b: Secondary | ICD-10-CM | POA: Diagnosis not present

## 2021-07-05 DIAGNOSIS — Z0000 Encounter for general adult medical examination without abnormal findings: Secondary | ICD-10-CM | POA: Diagnosis not present

## 2021-07-05 DIAGNOSIS — E6609 Other obesity due to excess calories: Secondary | ICD-10-CM | POA: Diagnosis not present

## 2021-07-05 DIAGNOSIS — K760 Fatty (change of) liver, not elsewhere classified: Secondary | ICD-10-CM | POA: Diagnosis not present

## 2021-07-05 DIAGNOSIS — K219 Gastro-esophageal reflux disease without esophagitis: Secondary | ICD-10-CM | POA: Diagnosis not present

## 2021-07-05 DIAGNOSIS — I129 Hypertensive chronic kidney disease with stage 1 through stage 4 chronic kidney disease, or unspecified chronic kidney disease: Secondary | ICD-10-CM | POA: Diagnosis not present

## 2021-07-05 DIAGNOSIS — Z1339 Encounter for screening examination for other mental health and behavioral disorders: Secondary | ICD-10-CM | POA: Diagnosis not present

## 2021-07-06 ENCOUNTER — Other Ambulatory Visit: Payer: Self-pay | Admitting: Internal Medicine

## 2021-07-06 DIAGNOSIS — K760 Fatty (change of) liver, not elsewhere classified: Secondary | ICD-10-CM

## 2021-07-11 ENCOUNTER — Other Ambulatory Visit (HOSPITAL_COMMUNITY): Payer: Self-pay

## 2021-07-20 ENCOUNTER — Ambulatory Visit
Admission: RE | Admit: 2021-07-20 | Discharge: 2021-07-20 | Disposition: A | Discharge status: Home / Self Care | Payer: PPO | Source: Ambulatory Visit | Attending: Internal Medicine | Admitting: Internal Medicine

## 2021-07-20 ENCOUNTER — Other Ambulatory Visit: Payer: Self-pay

## 2021-07-20 DIAGNOSIS — K760 Fatty (change of) liver, not elsewhere classified: Secondary | ICD-10-CM

## 2021-08-21 ENCOUNTER — Ambulatory Visit: Payer: PPO | Admitting: Physician Assistant

## 2021-08-21 ENCOUNTER — Telehealth: Payer: Self-pay | Admitting: Physician Assistant

## 2021-08-21 NOTE — Telephone Encounter (Signed)
[place], [place]    08/21/2021 1:09 PM

## 2021-08-21 NOTE — Telephone Encounter (Signed)
S/w pt and offered to come in and pt still does not feel well. Offered pt Virtual visit today. This visit today was a 6 month f/u. Pt is still coughing, nausea, feeling week.  Denies CP and shortness of breath.  Pt prefers to reschedule.  R/S appt with Nicki Reaper on October 4.  Will send to [place] to [place].

## 2021-08-21 NOTE — Telephone Encounter (Signed)
Patient states she tested positive for covid 2 weeks ago, but still has a cough. She would like to know if she can still come in to her appointment today.

## 2021-09-11 NOTE — Progress Notes (Signed)
[facility] Office Note:    Date:  09/12/2021   ID:  Katherine Rush, DOB September 02, 1946, MRN [id number]  PCP:  Burnard Bunting, MD   [facility] Providers Cardiologist:  Mertie Moores, MD Cardiology APP:  Sharmon Revere     Referring MD: Burnard Bunting, MD   Chief Complaint:  Follow-up for shortness of breath    Patient Profile:   Katherine Rush is a 75 y.o. female with:  Hypertension  Hyperlipidemia  Shortness of breath  Echocardiogram 12/21: EF 60-65, Gr 1 DD  Chronic back pain due to spinal stenosis + DDD FHx of CAD  Morbid obesity  Chronic leg edema  GERD Hepatic steatosis (CT in 9/21) Aortic atherosclerosis   Prior CV studies: Echocardiogram 11/14/20 EF 60-65, no RWMA, mild LVH, GR 1 DD, normal RVSF, trivial MR, mild AV sclerosis without stenosis   History of Present Illness: Katherine Rush was evaluated by Dr. Acie Fredrickson for shortness of breath and was last seen in 3/22.  Her work up was neg for any specific cardiac cause.   She returns for follow-up.  She is here alone.  She continues to have shortness of breath with exertion.  Of note, she was diagnosed with COVID-19 about 5 weeks ago.  She still feels fatigued from that.  She never really lost her taste but has had a bad taste in her mouth since.  She also had a sore tongue.  She did take one of the antivirals.  She sleeps on an incline because of her back.  She has significant lower extremity swelling that does not improve with elevation.  She has had some chest discomfort the past few weeks.  This seems to occur after meals and not necessarily with exertion.  She thinks that it is related to acid reflux.  She has not had syncope.    Past Medical History:  Diagnosis Date   Allergic rhinitis    Gastroesophageal reflux disease    History of colon polyps    Hyperlipidemia    Hypertension    IBS (irritable bowel syndrome)    Non-alcoholic fatty liver disease    Spinal stenosis of lumbar region     Current Medications: Current Meds  Medication Sig   acetaminophen (TYLENOL) 500 MG tablet Take 500 mg by mouth every 6 (six) hours as needed.   carvedilol (COREG) 6.25 MG tablet TAKE 1 TABLET (6.25 MG TOTAL) BY MOUTH 2 (TWO) TIMES DAILY.   COVID-19 mRNA vaccine, Pfizer, 30 MCG/0.3ML injection USE AS DIRECTED   furosemide (LASIX) 40 MG tablet Take 1 tablet (40 mg total) by mouth daily.   olmesartan (BENICAR) 40 MG tablet Take 40 mg by mouth daily.   omeprazole (PRILOSEC OTC) 20 MG tablet Take 20 mg by mouth daily.   Probiotic Product (PROBIOTIC DAILY) CAPS Take 1 capsule by mouth daily.   [DISCONTINUED] chlorthalidone (HYGROTON) 25 MG tablet TAKE 1/2 TABLET (12.5 MG TOTAL) BY MOUTH DAILY.    Allergies:   Shellfish-derived products, Iodine, Ivp dye [iodinated diagnostic agents], Other, Penicillins, and Sulfonamide derivatives   Social History   Tobacco Use   Smoking status: Never   Smokeless tobacco: Never  Vaping Use   Vaping Use: Never used  Substance Use Topics   Alcohol use: No    Alcohol/week: 0.0 standard drinks   Drug use: No    Family Hx: The patient's family history includes Breast cancer in her cousin and maternal aunt; Cancer in her maternal aunt; Diabetes in her mother;  Heart attack in her father, maternal grandfather, and mother; Other in an other family member; Stroke in her maternal grandmother and mother; Throat cancer in her cousin. There is no history of Colon cancer.  Review of Systems  Gastrointestinal:  Negative for hematochezia.  Genitourinary:  Negative for hematuria.    EKGs/Labs/Other Test Reviewed:    EKG:  EKG is not ordered today.  The ekg ordered today demonstrates n/a  Recent Labs: 11/01/2020: BUN 34; Creatinine, Ser 1.61; Potassium 4.5; Sodium 141   Recent Lipid Panel No results found for: CHOL, TRIG, HDL, LDLCALC, LDLDIRECT   Risk Assessment/Calculations:          Physical Exam:    VS:  BP 140/80   Pulse 94   Ht 5\' 2"  (1.575 m)    Wt 224 lb 9.6 oz (101.9 kg)   SpO2 96%   BMI 41.08 kg/m     Wt Readings from Last 3 Encounters:  09/12/21 224 lb 9.6 oz (101.9 kg)  02/13/21 219 lb (99.3 kg)  11/01/20 208 lb 9.6 oz (94.6 kg)    Constitutional:      Appearance: Healthy appearance. Not in distress.  Neck:     Vascular: No JVR. JVD normal.  Pulmonary:     Effort: Pulmonary effort is normal.     Breath sounds: No wheezing. No rales.  Cardiovascular:     Normal rate. Regular rhythm. Normal S1. Normal S2.      Murmurs: There is no murmur.  Edema:    Peripheral edema (tight) present.    Pretibial: bilateral 1+ edema of the pretibial area. Abdominal:     Palpations: Abdomen is soft.  Skin:    General: Skin is warm and dry.  Neurological:     Mental Status: Alert and oriented to person, place and time.     Cranial Nerves: Cranial nerves are intact.         ASSESSMENT & PLAN:   1. Shortness of breath 2. Precordial chest pain 3. Grade I diastolic dysfunction She continues to have significant shortness of breath with exertion.  She is NYHA III.  Edema, her neck veins are flat her lungs are clear.  She does have mild diastolic dysfunction on echocardiogram.  Given her chronic kidney disease, I suspect that her shortness of breath is related to volume excess and heart failure with preserved ejection fraction.  She does have some chest discomfort and a family history of coronary artery disease.  Her chest discomfort is likely noncardiac.  However, given her symptoms, I think it warrants further ischemic testing.  I worry about putting her through a coronary CTA given her chronic kidney disease.  I have recommended proceeding with a stress test.  If her stress test is high risk, we will likely need to proceed with cardiac catheterization.  -DC chlorthalidone  -Start furosemide 40 mg daily  -Lexiscan Myoview  -BMET, NTproBNP  -Repeat BMET 1 week  -Follow-up with me in 4 to 6 weeks  4. Essential hypertension Blood  pressure is somewhat elevated.  Continue olmesartan 40 mg daily, carvedilol 6.25 mg twice daily.  Change chlorthalidone to furosemide as noted.  If blood pressure continues to be difficult to control, consider spironolactone if renal function will allow.  5. Chronic kidney disease  Obtain BMET today and repeat BMET again in 1 week.    Shared Decision Making/Informed Consent The risks [chest pain, shortness of breath, cardiac arrhythmias, dizziness, blood pressure fluctuations, myocardial infarction, stroke/transient ischemic attack, nausea, vomiting, allergic  reaction, radiation exposure, metallic taste sensation and life-threatening complications (estimated to be 1 in 10,000)], benefits (risk stratification, diagnosing coronary artery disease, treatment guidance) and alternatives of a nuclear stress test were discussed in detail with Ms. Houp and she agrees to proceed.   Dispo:  Return in about 4 weeks (around 10/10/2021) for Routine follow up in 4 weeks with [facility]. .   Medication Adjustments/Labs and Tests Ordered: Current medicines are reviewed at length with the patient today.  Concerns regarding medicines are outlined above.  Tests Ordered: Orders Placed This Encounter  Procedures   Basic Metabolic Panel (BMET)   Basic Metabolic Panel (BMET)   Pro b natriuretic peptide   Cardiac Stress Test: Informed Consent Details: Physician/Practitioner Attestation; Transcribe to consent form and obtain patient signature   Myocardial Perfusion Imaging    Medication Changes: Meds ordered this encounter  Medications   furosemide (LASIX) 40 MG tablet    Sig: Take 1 tablet (40 mg total) by mouth daily.    Dispense:  30 tablet    Refill:  [id number] Richardson Dopp, [place]  09/12/2021 5:44 PM    [facility] [place], [place], [place]  [address] Phone: [phone]; Fax: [phone]

## 2021-09-12 ENCOUNTER — Ambulatory Visit: Payer: PPO | Admitting: Physician Assistant

## 2021-09-12 ENCOUNTER — Encounter: Payer: Self-pay | Admitting: Physician Assistant

## 2021-09-12 ENCOUNTER — Other Ambulatory Visit: Payer: Self-pay

## 2021-09-12 VITALS — BP 140/80 | HR 94 | Ht 62.0 in | Wt 224.6 lb

## 2021-09-12 DIAGNOSIS — I700 Atherosclerosis of aorta: Secondary | ICD-10-CM

## 2021-09-12 DIAGNOSIS — R072 Precordial pain: Secondary | ICD-10-CM

## 2021-09-12 DIAGNOSIS — R0602 Shortness of breath: Secondary | ICD-10-CM | POA: Diagnosis not present

## 2021-09-12 DIAGNOSIS — I5189 Other ill-defined heart diseases: Secondary | ICD-10-CM | POA: Diagnosis not present

## 2021-09-12 DIAGNOSIS — E7800 Pure hypercholesterolemia, unspecified: Secondary | ICD-10-CM

## 2021-09-12 DIAGNOSIS — I10 Essential (primary) hypertension: Secondary | ICD-10-CM

## 2021-09-12 DIAGNOSIS — R079 Chest pain, unspecified: Secondary | ICD-10-CM | POA: Diagnosis not present

## 2021-09-12 DIAGNOSIS — N1831 Chronic kidney disease, stage 3a: Secondary | ICD-10-CM

## 2021-09-12 MED ORDER — FUROSEMIDE 40 MG PO TABS: 40.0000 mg | ORAL_TABLET | Freq: Every day | ORAL | 9 refills | Status: DC

## 2021-09-12 NOTE — Patient Instructions (Signed)
Medication Instructions:   DISCONTINUE CHLORTHALIDONE START LASIX one tablet by mouth ( 40 mg) daily.    *If you need a refill on your cardiac medications before your next appointment, please call your pharmacy*   Lab Work: TODAY!!!!  BMET/PRO BNP  Your physician recommends that you return for lab work on the day of your stress test, October 13 @ 10:30 am.    If you have labs (blood work) drawn today and your tests are completely normal, you will receive your results only by: [place] (if you have MyChart) OR A paper copy in the mail If you have any lab test that is abnormal or we need to change your treatment, we will call you to review the results.   Testing/Procedures: You are scheduled for a Myocardial Perfusion Imaging Study on Thursday, October 13 at 10:30 am.    Please arrive 15 minutes prior to your appointment time for registration and insurance purposes.   The test will take approximately 3 to 4 hours to complete; you may bring reading material. If someone comes with you to your appointment, they will need to remain in the main lobby due to limited space in the testing area.   How to prepare for your Myocardial Perfusion test:   Do not eat or drink 3 hours prior to your test, except you may have water.    Do not consume products containing caffeine (regular or decaffeinated) 12 hours prior to your test (ex: coffee, chocolate, soda, tea)   Do bring a list of your current medications with you. If not listed below, you may take your medications as normal.   Bring any held medication to your appointment, as you may be required to take it once the test is complete.   Do wear comfortable clothes (no dresses) and walking shoes. Tennis shoes are preferred. No heels or open toed shoes.  Do not wear perfume, or lotions (deodorant is allowed).   If these instructions are not followed, you test will have to be rescheduled.   Please report to [address]  [address] for your test. If you have questions or concerns about your appointment, please call the Nuclear Lab at [phone].  If you cannot keep your appointment, please provide 24 hour notification to the Nuclear lab to avoid a possible $50 charge to your account.       Follow-Up: At [facility], you and your health needs are our priority.  As part of our continuing mission to provide you with exceptional heart care, we have created designated Provider Care Teams.  These Care Teams include your primary Cardiologist (physician) and Advanced Practice Providers (APPs -  Physician Assistants and Nurse Practitioners) who all work together to provide you with the care you need, when you need it.  We recommend signing up for the patient portal called "MyChart".  Sign up information is provided on this After Visit Summary.  MyChart is used to connect with patients for Virtual Visits (Telemedicine).  Patients are able to view lab/test results, encounter notes, upcoming appointments, etc.  Non-urgent messages can be sent to your provider as well.   To learn more about what you can do with MyChart, go to [email or web address].    Your next appointment:   4 week(s)  The format for your next appointment:   In Person  Provider:   Richardson Dopp, PA-C   Other Instructions

## 2021-09-13 LAB — BASIC METABOLIC PANEL
BUN/Creatinine Ratio: 18 (ref 12–28)
BUN: 31 mg/dL — ABNORMAL HIGH (ref 8–27)
CO2: 22 mmol/L (ref 20–29)
Calcium: 9.3 mg/dL (ref 8.7–10.3)
Chloride: 103 mmol/L (ref 96–106)
Creatinine, Ser: 1.75 mg/dL — ABNORMAL HIGH (ref 0.57–1.00)
Glucose: 101 mg/dL — ABNORMAL HIGH (ref 70–99)
Potassium: 4.7 mmol/L (ref 3.5–5.2)
Sodium: 140 mmol/L (ref 134–144)
eGFR: 30 mL/min/{1.73_m2} — ABNORMAL LOW (ref >59)

## 2021-09-13 LAB — PRO B NATRIURETIC PEPTIDE: NT-Pro BNP: 65 pg/mL (ref 0–301)

## 2021-09-20 ENCOUNTER — Telehealth (HOSPITAL_COMMUNITY): Payer: Self-pay

## 2021-09-20 NOTE — Telephone Encounter (Signed)
Spoke with the patient, detailed instructions given. She stated that she would be here for her test. Asked to call back with any questions. Katherine Rush EMTP 

## 2021-09-21 ENCOUNTER — Encounter: Payer: Self-pay | Admitting: Physician Assistant

## 2021-09-21 ENCOUNTER — Other Ambulatory Visit: Payer: Self-pay

## 2021-09-21 ENCOUNTER — Ambulatory Visit (HOSPITAL_COMMUNITY): Payer: PPO | Attending: Internal Medicine

## 2021-09-21 ENCOUNTER — Other Ambulatory Visit: Payer: PPO

## 2021-09-21 DIAGNOSIS — R079 Chest pain, unspecified: Secondary | ICD-10-CM

## 2021-09-21 DIAGNOSIS — I5189 Other ill-defined heart diseases: Secondary | ICD-10-CM

## 2021-09-21 DIAGNOSIS — I10 Essential (primary) hypertension: Secondary | ICD-10-CM

## 2021-09-21 DIAGNOSIS — N1831 Chronic kidney disease, stage 3a: Secondary | ICD-10-CM | POA: Diagnosis not present

## 2021-09-21 DIAGNOSIS — R0602 Shortness of breath: Secondary | ICD-10-CM

## 2021-09-21 DIAGNOSIS — R072 Precordial pain: Secondary | ICD-10-CM | POA: Diagnosis not present

## 2021-09-21 LAB — MYOCARDIAL PERFUSION IMAGING
Base ST Depression (mm): 0 mm
LV dias vol: 36 mL (ref 46–106)
LV sys vol: 5 mL
Nuc Stress EF: 86 %
Peak HR: 104 {beats}/min
Rest HR: 76 {beats}/min
Rest Nuclear Isotope Dose: 10.9 mCi
SDS: 1
SRS: 0
SSS: 1
ST Depression (mm): 0 mm
Stress Nuclear Isotope Dose: 31.5 mCi
TID: 0.88

## 2021-09-21 LAB — BASIC METABOLIC PANEL
BUN/Creatinine Ratio: 24 (ref 12–28)
BUN: 48 mg/dL — ABNORMAL HIGH (ref 8–27)
CO2: 23 mmol/L (ref 20–29)
Calcium: 9.5 mg/dL (ref 8.7–10.3)
Chloride: 102 mmol/L (ref 96–106)
Creatinine, Ser: 2.01 mg/dL — ABNORMAL HIGH (ref 0.57–1.00)
Glucose: 107 mg/dL — ABNORMAL HIGH (ref 70–99)
Potassium: 4.9 mmol/L (ref 3.5–5.2)
Sodium: 140 mmol/L (ref 134–144)
eGFR: 26 mL/min/{1.73_m2} — ABNORMAL LOW (ref >59)

## 2021-09-21 MED ORDER — REGADENOSON 0.4 MG/5ML IV SOLN
0.4000 mg | Freq: Once | INTRAVENOUS | Status: AC
  Administered 2021-09-21: 0.4 mg via INTRAVENOUS

## 2021-09-21 MED ORDER — TECHNETIUM TC 99M TETROFOSMIN IV KIT
31.5000 | PACK | Freq: Once | INTRAVENOUS | Status: AC | PRN
  Administered 2021-09-21: 31.5 via INTRAVENOUS
  Filled 2021-09-21: qty 32

## 2021-09-21 MED ORDER — TECHNETIUM TC 99M TETROFOSMIN IV KIT
10.9000 | PACK | Freq: Once | INTRAVENOUS | Status: AC | PRN
  Administered 2021-09-21: 10.9 via INTRAVENOUS
  Filled 2021-09-21: qty 11

## 2021-09-22 ENCOUNTER — Other Ambulatory Visit: Payer: Self-pay | Admitting: *Deleted

## 2021-09-22 DIAGNOSIS — R748 Abnormal levels of other serum enzymes: Secondary | ICD-10-CM

## 2021-09-22 MED ORDER — FUROSEMIDE 40 MG PO TABS: 20.0000 mg | ORAL_TABLET | Freq: Every day | ORAL | 9 refills | Status: DC

## 2021-09-28 ENCOUNTER — Other Ambulatory Visit: Payer: Self-pay

## 2021-09-28 ENCOUNTER — Other Ambulatory Visit: Payer: PPO

## 2021-09-28 DIAGNOSIS — R748 Abnormal levels of other serum enzymes: Secondary | ICD-10-CM

## 2021-09-29 LAB — BASIC METABOLIC PANEL
BUN/Creatinine Ratio: 18 (ref 12–28)
BUN: 33 mg/dL — ABNORMAL HIGH (ref 8–27)
CO2: 25 mmol/L (ref 20–29)
Calcium: 9.5 mg/dL (ref 8.7–10.3)
Chloride: 103 mmol/L (ref 96–106)
Creatinine, Ser: 1.84 mg/dL — ABNORMAL HIGH (ref 0.57–1.00)
Glucose: 75 mg/dL (ref 70–99)
Potassium: 4.8 mmol/L (ref 3.5–5.2)
Sodium: 142 mmol/L (ref 134–144)
eGFR: 28 mL/min/{1.73_m2} — ABNORMAL LOW (ref >59)

## 2021-09-30 DIAGNOSIS — Z23 Encounter for immunization: Secondary | ICD-10-CM | POA: Diagnosis not present

## 2021-10-12 DIAGNOSIS — N1831 Chronic kidney disease, stage 3a: Secondary | ICD-10-CM | POA: Insufficient documentation

## 2021-10-12 NOTE — Progress Notes (Signed)
Cardiology Office Note:    Date:  10/13/2021   ID:  Katherine Rush, DOB 1946/09/05, MRN [id number]  PCP:  Burnard Bunting, MD   [facility] Providers Cardiologist:  Mertie Moores, MD Cardiology APP:  Sharmon Revere     Referring MD: Burnard Bunting, MD   Chief Complaint:  F/u for dyspnea    Patient Profile:   Katherine Rush is a 75 y.o. female with:  (HFpEF) heart failure with preserved ejection fraction  Hypertension  Hyperlipidemia  Shortness of breath  Echocardiogram 12/21: EF 60-65, Gr 1 DD  Myoview 10/22: EF 86, no ischemia Chronic back pain due to spinal stenosis + DDD FHx of CAD  Morbid obesity  Chronic leg edema  GERD Hepatic steatosis (CT in 9/21) Aortic atherosclerosis   History of Present Illness: Ms. Katherine Rush was last seen 09/12/2021 with symptoms of shortness of breath as well as chest discomfort.  I placed her on furosemide for volume excess and stopped her chlorthalidone.  BNP was normal.  Follow-up creatinine was increased.  I decreased her furosemide dose.  A nuclear stress test was low risk.  She returns for follow-up.  She is here alone.  She feels betters since starting to take furosemide.  She has not had any more chest pain.  Her breathing is improved as well as her leg edema.  She is able to lay more flat but is still on some pillows.  She has not had syncope.   ASSESSMENT & PLAN:   (HFpEF) heart failure with preserved ejection fraction (HCC) Echocardiogram in December 2021 demonstrated mild diastolic dysfunction.  She has had recent clinical improvement of shortness of breath and swelling with initiation of furosemide.  She is currently NYHA II-IIb.  Volume status appears to be stable.  Continue current dose of furosemide.  We discussed the importance of weighing daily and limiting salt.  She knows to take an extra dose of furosemide if her weight goes up 3 pounds or more in 1 day.  Follow-up in 6 months.  Stage 3a chronic kidney disease  (HCC) Recent creatinine stable. Lab Results  Component Value Date   CREATININE 1.84 (H) 09/28/2021   CREATININE 2.01 (H) 09/21/2021   CREATININE 1.75 (H) 09/12/2021    Essential hypertension Blood pressure well controlled on current regimen which includes carvedilol, olmesartan, furosemide.         Dispo:  Return in about 6 months (around 04/12/2022) for Routine follow up in 6 months with Dr. Acie Fredrickson or Richardson Dopp, PA-C.Katherine Rush    Prior CV studies: GATED SPECT MYO PERF W/LEXISCAN STRESS 1D 09/21/2021 Narrative   The study is normal. The study is low risk.   No ST deviation was noted.   LV perfusion is normal. There is no evidence of ischemia. There is no evidence of infarction.   Left ventricular function is normal. Nuclear stress EF: 86 %. The left ventricular ejection fraction is hyperdynamic (>65%). End diastolic cavity size is normal.   Prior study not available for comparison.  Echocardiogram 11/14/20 EF 60-65, no RWMA, mild LVH, GR 1 DD, normal RVSF, trivial MR, mild AV sclerosis without stenosis      Past Medical History:  Diagnosis Date   Allergic rhinitis    Gastroesophageal reflux disease    History of colon polyps    Hyperlipidemia    Hypertension    IBS (irritable bowel syndrome)    Lexiscan Myoview 09/2021    Myoview 10/22: no ischemia, EF 86, low risk  Non-alcoholic fatty liver disease    Spinal stenosis of lumbar region    Current Medications: Current Meds  Medication Sig   acetaminophen (TYLENOL) 500 MG tablet Take 500 mg by mouth every 6 (six) hours as needed.   carvedilol (COREG) 6.25 MG tablet TAKE 1 TABLET (6.25 MG TOTAL) BY MOUTH 2 (TWO) TIMES DAILY.   COVID-19 mRNA vaccine, Pfizer, 30 MCG/0.3ML injection USE AS DIRECTED   furosemide (LASIX) 40 MG tablet Take 0.5 tablets (20 mg total) by mouth daily.   olmesartan (BENICAR) 40 MG tablet Take 40 mg by mouth daily.   omeprazole (PRILOSEC OTC) 20 MG tablet Take 20 mg by mouth every other day.   ondansetron  (ZOFRAN) 4 MG tablet Take 4 mg by mouth every 8 (eight) hours as needed for nausea or vomiting.   Probiotic Product (PROBIOTIC DAILY) CAPS Take 1 capsule by mouth daily.    Allergies:   Shellfish-derived products, Iodine, Ivp dye [iodinated diagnostic agents], Other, Penicillins, and Sulfonamide derivatives   Social History   Tobacco Use   Smoking status: Never   Smokeless tobacco: Never  Vaping Use   Vaping Use: Never used  Substance Use Topics   Alcohol use: No    Alcohol/week: 0.0 standard drinks   Drug use: No    Family Hx: The patient's family history includes Breast cancer in her cousin and maternal aunt; Cancer in her maternal aunt; Diabetes in her mother; Heart attack in her father, maternal grandfather, and mother; Other in an other family member; Stroke in her maternal grandmother and mother; Throat cancer in her cousin. There is no history of Colon cancer.  ROS see HPI  EKGs/Labs/Other Test Reviewed:    EKG:  EKG is not ordered today.  The ekg ordered today demonstrates n/a EKG was done recently with Myoview   Recent Labs: 09/12/2021: NT-Pro BNP 65 09/28/2021: BUN 33; Creatinine, Ser 1.84; Potassium 4.8; Sodium 142   Recent Lipid Panel No results found for: CHOL, TRIG, HDL, LDLCALC, LDLDIRECT   Risk Assessment/Calculations:          Physical Exam:    VS:  BP 128/70   Pulse 75   Ht 5\' 2"  (1.575 m)   Wt 221 lb 9.6 oz (100.5 kg)   SpO2 97%   BMI 40.53 kg/m     Wt Readings from Last 3 Encounters:  10/13/21 221 lb 9.6 oz (100.5 kg)  09/21/21 224 lb (101.6 kg)  09/12/21 224 lb 9.6 oz (101.9 kg)    Constitutional:      Appearance: Healthy appearance. Not in distress.  Neck:     Vascular: No JVR. JVD normal.  Pulmonary:     Effort: Pulmonary effort is normal.     Breath sounds: No wheezing. No rales.  Cardiovascular:     Normal rate. Regular rhythm. Normal S1. Normal S2.      Murmurs: There is a grade 1/6 systolic murmur at the URSB.  Edema:     Peripheral edema present.    Ankle: bilateral trace edema of the ankle. Abdominal:     Palpations: Abdomen is soft.  Skin:    General: Skin is warm and dry.  Neurological:     Mental Status: Alert and oriented to person, place and time.     Cranial Nerves: Cranial nerves are intact.        Medication Adjustments/Labs and Tests Ordered: Current medicines are reviewed at length with the patient today.  Concerns regarding medicines are outlined above.  Tests Ordered:  No orders of the defined types were placed in this encounter.  Medication Changes: No orders of the defined types were placed in this encounter.  Signed, Richardson Dopp, PA-C  10/13/2021 11:42 AM    [facility] [place], [place], [place]  [address] Phone: [phone]; Fax: [phone]

## 2021-10-13 ENCOUNTER — Ambulatory Visit: Payer: PPO | Admitting: Physician Assistant

## 2021-10-13 ENCOUNTER — Encounter: Payer: Self-pay | Admitting: Physician Assistant

## 2021-10-13 ENCOUNTER — Other Ambulatory Visit: Payer: Self-pay

## 2021-10-13 VITALS — BP 128/70 | HR 75 | Ht 62.0 in | Wt 221.6 lb

## 2021-10-13 DIAGNOSIS — I5032 Chronic diastolic (congestive) heart failure: Secondary | ICD-10-CM | POA: Diagnosis not present

## 2021-10-13 DIAGNOSIS — N1831 Chronic kidney disease, stage 3a: Secondary | ICD-10-CM | POA: Diagnosis not present

## 2021-10-13 DIAGNOSIS — R0602 Shortness of breath: Secondary | ICD-10-CM

## 2021-10-13 DIAGNOSIS — I10 Essential (primary) hypertension: Secondary | ICD-10-CM | POA: Diagnosis not present

## 2021-10-13 NOTE — Assessment & Plan Note (Signed)
Blood pressure well controlled on current regimen which includes carvedilol, olmesartan, furosemide.

## 2021-10-13 NOTE — Assessment & Plan Note (Addendum)
Echocardiogram in December 2021 demonstrated mild diastolic dysfunction.  She has had recent clinical improvement of shortness of breath and swelling with initiation of furosemide.  She is currently NYHA II-IIb.  Volume status appears to be stable.  Continue current dose of furosemide.  We discussed the importance of weighing daily and limiting salt.  She knows to take an extra dose of furosemide if her weight goes up 3 pounds or more in 1 day.  Follow-up in 6 months.

## 2021-10-13 NOTE — Patient Instructions (Addendum)
Medication Instructions:   Your physician recommends that you continue on your current medications as directed. Please refer to the Current Medication list given to you today.  *If you need a refill on your cardiac medications before your next appointment, please call your pharmacy*   Lab Work:  -NONE  If you have labs (blood work) drawn today and your tests are completely normal, you will receive your results only by: [place] (if you have MyChart) OR A paper copy in the mail If you have any lab test that is abnormal or we need to change your treatment, we will call you to review the results.   Testing/Procedures:  -NONE   Follow-Up: At [facility], you and your health needs are our priority.  As part of our continuing mission to provide you with exceptional heart care, we have created designated Provider Care Teams.  These Care Teams include your primary Cardiologist (physician) and Advanced Practice Providers (APPs -  Physician Assistants and Nurse Practitioners) who all work together to provide you with the care you need, when you need it.  We recommend signing up for the patient portal called "MyChart".  Sign up information is provided on this After Visit Summary.  MyChart is used to connect with patients for Virtual Visits (Telemedicine).  Patients are able to view lab/test results, encounter notes, upcoming appointments, etc.  Non-urgent messages can be sent to your provider as well.   To learn more about what you can do with MyChart, go to [email or web address].    Your next appointment:   6 month(s)  The format for your next appointment:   In Person  Provider:   Mertie Moores, MD  or Richardson Dopp, PA-C     {   Other Instructions  Your physician wants you to follow-up in: 6 Months with Dr.Nahser or [facility].  You will receive a reminder letter in the mail two months in advance. If you don't receive a letter, please call our office to schedule the  follow-up appointment.  Please weigh daily if your weight goes up 3 lbs in one day take ( 40 mg) Lasix.

## 2021-10-13 NOTE — Assessment & Plan Note (Signed)
Recent creatinine stable. Lab Results  Component Value Date   CREATININE 1.84 (H) 09/28/2021   CREATININE 2.01 (H) 09/21/2021   CREATININE 1.75 (H) 09/12/2021

## 2021-11-08 DIAGNOSIS — N1832 Chronic kidney disease, stage 3b: Secondary | ICD-10-CM | POA: Diagnosis not present

## 2021-11-08 DIAGNOSIS — E785 Hyperlipidemia, unspecified: Secondary | ICD-10-CM | POA: Diagnosis not present

## 2021-11-08 DIAGNOSIS — I10 Essential (primary) hypertension: Secondary | ICD-10-CM | POA: Diagnosis not present

## 2021-11-08 DIAGNOSIS — I129 Hypertensive chronic kidney disease with stage 1 through stage 4 chronic kidney disease, or unspecified chronic kidney disease: Secondary | ICD-10-CM | POA: Diagnosis not present

## 2021-11-14 DIAGNOSIS — Z8616 Personal history of COVID-19: Secondary | ICD-10-CM | POA: Diagnosis not present

## 2021-11-14 DIAGNOSIS — R110 Nausea: Secondary | ICD-10-CM | POA: Diagnosis not present

## 2021-11-14 DIAGNOSIS — K760 Fatty (change of) liver, not elsewhere classified: Secondary | ICD-10-CM | POA: Diagnosis not present

## 2021-11-14 DIAGNOSIS — K219 Gastro-esophageal reflux disease without esophagitis: Secondary | ICD-10-CM | POA: Diagnosis not present

## 2021-11-15 ENCOUNTER — Other Ambulatory Visit: Payer: Self-pay | Admitting: Cardiovascular Disease

## 2021-12-20 ENCOUNTER — Other Ambulatory Visit (INDEPENDENT_AMBULATORY_CARE_PROVIDER_SITE_OTHER): Payer: PPO

## 2021-12-20 ENCOUNTER — Encounter: Payer: Self-pay | Admitting: Physician Assistant

## 2021-12-20 ENCOUNTER — Ambulatory Visit: Payer: PPO | Admitting: Nurse Practitioner

## 2021-12-20 ENCOUNTER — Ambulatory Visit: Payer: PPO | Admitting: Physician Assistant

## 2021-12-20 VITALS — BP 110/59 | HR 72 | Ht 62.0 in | Wt 218.4 lb

## 2021-12-20 DIAGNOSIS — R140 Abdominal distension (gaseous): Secondary | ICD-10-CM

## 2021-12-20 DIAGNOSIS — R110 Nausea: Secondary | ICD-10-CM | POA: Diagnosis not present

## 2021-12-20 DIAGNOSIS — R1013 Epigastric pain: Secondary | ICD-10-CM

## 2021-12-20 LAB — CBC WITH DIFFERENTIAL/PLATELET
Basophils Absolute: 0.1 10*3/uL (ref 0.0–0.1)
Basophils Relative: 0.7 % (ref 0.0–3.0)
Eosinophils Absolute: 0.5 10*3/uL (ref 0.0–0.7)
Eosinophils Relative: 5.2 % — ABNORMAL HIGH (ref 0.0–5.0)
HCT: 41.1 % (ref 36.0–46.0)
Hemoglobin: 13.5 g/dL (ref 12.0–15.0)
Lymphocytes Relative: 23.3 % (ref 12.0–46.0)
Lymphs Abs: 2.3 10*3/uL (ref 0.7–4.0)
MCHC: 32.8 g/dL (ref 30.0–36.0)
MCV: 94.4 fl (ref 78.0–100.0)
Monocytes Absolute: 1 10*3/uL (ref 0.1–1.0)
Monocytes Relative: 10.5 % (ref 3.0–12.0)
Neutro Abs: 5.9 10*3/uL (ref 1.4–7.7)
Neutrophils Relative %: 60.3 % (ref 43.0–77.0)
Platelets: 204 10*3/uL (ref 150.0–400.0)
RBC: 4.36 Mil/uL (ref 3.87–5.11)
RDW: 13.5 % (ref 11.5–15.5)
WBC: 9.8 10*3/uL (ref 4.0–10.5)

## 2021-12-20 LAB — COMPREHENSIVE METABOLIC PANEL
ALT: 73 U/L — ABNORMAL HIGH (ref 0–35)
AST: 80 U/L — ABNORMAL HIGH (ref 0–37)
Albumin: 4.2 g/dL (ref 3.5–5.2)
Alkaline Phosphatase: 98 U/L (ref 39–117)
BUN: 37 mg/dL — ABNORMAL HIGH (ref 6–23)
CO2: 28 mEq/L (ref 19–32)
Calcium: 10 mg/dL (ref 8.4–10.5)
Chloride: 104 mEq/L (ref 96–112)
Creatinine, Ser: 1.95 mg/dL — ABNORMAL HIGH (ref 0.40–1.20)
GFR: 24.78 mL/min — ABNORMAL LOW (ref >60.00)
Glucose, Bld: 86 mg/dL (ref 70–99)
Potassium: 4.8 mEq/L (ref 3.5–5.1)
Sodium: 140 mEq/L (ref 135–145)
Total Bilirubin: 0.5 mg/dL (ref 0.2–1.2)
Total Protein: 7.6 g/dL (ref 6.0–8.3)

## 2021-12-20 MED ORDER — NYSTATIN 100000 UNIT/ML MT SUSP: 5.0000 mL | Freq: Four times a day (QID) | OROMUCOSAL | 0 refills | Status: AC

## 2021-12-20 MED ORDER — PROMETHAZINE HCL 25 MG PO TABS: 12.5000 mg | ORAL_TABLET | Freq: Four times a day (QID) | ORAL | 0 refills | Status: DC | PRN

## 2021-12-20 MED ORDER — OMEPRAZOLE 40 MG PO CPDR: 40.0000 mg | DELAYED_RELEASE_CAPSULE | Freq: Two times a day (BID) | ORAL | 1 refills | Status: DC

## 2021-12-20 NOTE — Patient Instructions (Signed)
If you are age 76 or older, your body mass index should be between 23-30. Your Body mass index is 39.95 kg/m. If this is out of the aforementioned range listed, please consider follow up with your Primary Care Provider. ________________________________________________________  The [place] [facility] providers would like to encourage you to use [facility] to communicate with providers for non-urgent requests or questions.  Due to long hold times on the telephone, sending your provider a message by [facility] may be a faster and more efficient way to get a response.  Please allow 48 business hours for a response.  Please remember that this is for non-urgent requests.  _______________________________________________________ Dennis Bast have been given a testing kit to check for small intestine bacterial overgrowth (SIBO) which is completed by a company named [employer]. Make sure to return your test in the mail using the return mailing label given to you along with the kit. Your demographic and insurance information have already been sent to the company and they should be in contact with you over the next 2-3 weeks regarding this test. [facility] will collect an upfront charge of $99.74 for commercial insurance plans and $209.74 is you are paying cash. Make sure to discuss with [facility] PRIOR to having the test if they have gotten informatoin from your insurance company as to how much your testing will cost out of pocket, if any. Please keep in mind that you will be getting a call from phone number [phone] or a similar number. If you do not hear from them within this time frame, please call our office at [phone].   Your provider has requested that you go to the basement level for lab work before leaving today. Press "B" on the elevator. The lab is located at the first door on the left as you exit the elevator.   You have been scheduled for an endoscopy. Please follow written instructions given to you  at your visit today. If you use inhalers (even only as needed), please bring them with you on the day of your procedure.  START Nystatin suspension 5 mL swish and spit every 4 hours INCREASE Omeprazole to 40 mg 1 capsule twice a day START Promethazine 25 1/2 tablet every 6 hours as needed  Follow up pending the results of you Endoscopy.  Thank you for entrusting me with your care and choosing [facility].  Amy Esterwood, PA-C

## 2021-12-20 NOTE — Progress Notes (Signed)
° °Subjective:  ° ° Patient ID: Katherine Rush, female    DOB: 12/26/1945, 76 y.o.   MRN: [id number] ° °HPI °Aradia is a pleasant 76-year-old white female established with Dr. Pyrtle who was last seen in our office about a year and a half ago.  She comes in today with 4-month history of persistent nausea which she says comes in waves, with good days and bad days.  She has had a lot of associated bloating and intestinal gas.  No diarrhea melena or hematochezia.  At onset of symptoms she had an episode of vomiting but has not been vomiting since.  Overall since onset of symptoms she is lost 4 5 pounds, she denies any overt early satiety type symptoms. °She says that she was diagnosed with COVID at the end of August 2022 and that is when her symptoms started.  About 10 days into being ill with COVID she also developed a very sore tongue and a horrible taste in her mouth.  She had been given a course of Magic mouthwash but really did not have any significant improvement with that.  Over the past couple of months she has had a persistent bad taste in her mouth and foods still do not taste normal.  This has been along with all of the nausea symptoms.  She had been seen by her PCP has been chronically on omeprazole 20 mg once daily that was increased to twice daily and she has been taking Zofran 4 mg as needed though not on any sort of scheduled basis.  She does not find the Zofran very helpful.  She says most of the time she feels better if she has a little bit of food on her stomach °She does have history of IBS, is status post appendectomy and hysterectomy has chronic kidney disease, spinal stenosis, nonalcoholic fatty liver disease history of congestive heart failure and hypertension. °Ultrasound in August 2022 showed diffuse increased echogenicity some hepatomegaly and portal vein with hepatopetal flow. °Last colonoscopy April 2021 done for positive fit test and history of polyps with one 7 mm polyp removed from  the sigmoid colon which was a tubular adenoma and internal hemorrhoids noted. °Prior CT imaging in 2021 had shown a slightly nodular contour of the liver and evidence of chronic calcific pancreatitis though patient has never had any history of pancreatitis. °She had very remote EGD in 2010 which was normal ° °Review of Systems Pertinent positive and negative review of systems were noted in the above HPI section.  All other review of systems was otherwise negative.  ° °Outpatient Encounter Medications as of 12/20/2021  °Medication Sig  ° acetaminophen (TYLENOL) 500 MG tablet Take 500 mg by mouth every 6 (six) hours as needed.  ° carvedilol (COREG) 6.25 MG tablet TAKE 1 TABLET BY MOUTH TWICE DAILY  ° furosemide (LASIX) 40 MG tablet Take 0.5 tablets (20 mg total) by mouth daily.  ° nystatin (MYCOSTATIN) 100000 UNIT/ML suspension Take 5 mLs (500,000 Units total) by mouth 4 (four) times daily for 14 days. Swish and spit  ° olmesartan (BENICAR) 40 MG tablet Take 40 mg by mouth daily.  ° omeprazole (PRILOSEC) 40 MG capsule Take 1 capsule (40 mg total) by mouth 2 (two) times daily at 8 am and 10 pm.  ° ondansetron (ZOFRAN) 4 MG tablet Take 4 mg by mouth every 8 (eight) hours as needed for nausea or vomiting.  ° Probiotic Product (PROBIOTIC DAILY) CAPS Take 1 capsule by mouth daily.  °   promethazine (PHENERGAN) 25 MG tablet Take 0.5 tablets (12.5 mg total) by mouth every 6 (six) hours as needed for nausea or vomiting.   [DISCONTINUED] omeprazole (PRILOSEC OTC) 20 MG tablet Take 20 mg by mouth 2 (two) times daily.   No facility-administered encounter medications on file as of 12/20/2021.   Allergies  Allergen Reactions   Shellfish-Derived Products Other (See Comments)   Iodine Rash   Ivp Dye [Iodinated Contrast Media] Rash   Other Rash   Penicillins Rash   Sulfonamide Derivatives Rash   Patient Active Problem List   Diagnosis Date Noted   Stage 3a chronic kidney disease ([place]) 10/12/2021   (HFpEF) heart failure  with preserved ejection fraction ([place]) 02/13/2021   Leg edema 11/01/2020   Leg cramps 11/01/2020   Benign breast cyst in female 11/19/2012   Menopausal symptom 09/25/2012   Fibrocystic breast disease 09/25/2012   Allergic rhinitis    Non-alcoholic fatty liver disease    Hyperlipidemia    Spinal stenosis of lumbar region    Gastroesophageal reflux disease    Essential hypertension    IBS (irritable bowel syndrome)    History of colon polyps    GERD 09/26/2009   IRRITABLE BOWEL SYNDROME 09/26/2009   NONSPEC ELEVATION OF LEVELS OF TRANSAMINASE/LDH 09/26/2009   NONSPECIFIC ABN FINDING RAD & OTH EXAM GI TRACT 09/26/2009   Social History   Socioeconomic History   Marital status: Single    Spouse name: Not on file   Number of children: Not on file   Years of education: Not on file   Highest education level: Not on file  Occupational History   Not on file  Tobacco Use   Smoking status: Never   Smokeless tobacco: Never  Vaping Use   Vaping Use: Never used  Substance and Sexual Activity   Alcohol use: No    Alcohol/week: 0.0 standard drinks   Drug use: No   Sexual activity: Not on file  Other Topics Concern   Not on file  Social History Narrative   Not on file   Social Determinants of Health   Financial Resource Strain: Not on file  Food Insecurity: Not on file  Transportation Needs: Not on file  Physical Activity: Not on file  Stress: Not on file  Social Connections: Not on file  Intimate Partner Violence: Not on file    Ms. Pring's family history includes Breast cancer in her cousin and maternal aunt; Cancer in her maternal aunt; Diabetes in her mother; Heart attack in her father, maternal grandfather, and mother; Other in an other family member; Stroke in her maternal grandmother and mother; Throat cancer in her cousin.      Objective:    Vitals:   12/20/21 0956  BP: (!) 110/59  Pulse: 72    Physical Exam Well-developed well-nourished f elderly white  female in no acute distress.  Height, Weight, 218 BMI 39.9  HEENT; nontraumatic normocephalic, EOMI, PE R LA, sclera anicteric. Oropharynx; no definite oral thrush, slight coating of the tongue, buccal mucosa without lesions or ulceration Neck; supple, no JVD Cardiovascular; regular rate and rhythm with S1-S2, no murmur rub or gallop Pulmonary; Clear bilaterally Abdomen; soft, obese no focal tenderness nondistended, no palpable mass or hepatosplenomegaly, bowel sounds are active Rectal; not done today Skin; benign exam, no jaundice rash or appreciable lesions Extremities; no clubbing cyanosis or edema skin warm and dry Neuro/Psych; alert and oriented x4, grossly nonfocal mood and affect appropriate  Assessment & Plan:   #76 76 year old white female with 37-monthhistory of persistent nausea, still experiencing waves of nausea periodically throughout the day, no vomiting, mild weight loss of 4 to 5 pounds.  Nausea has been associated with a lot of abdominal bloating and gassiness, no early satiety symptoms. Onset of symptoms when she contracted COVID-19 in August 2022.  She has also had persistent alteration of taste initially with a terrible taste in her mouth, less severe now but still present.  Minimal response to Magic mouthwash  I think her symptoms are all secondary to COVID/post-COVID related gut involvement with COVID. Rule out chronic gastropathy, peptic ulcer disease Some of the symptoms are consistent with postinfectious IBS and wonder about SIBO with complaints of significant bloating and gas  Patient with underlying IBS  #2 chronic GERD #3 nonalcoholic fatty liver disease #4 history of congestive heart failure-last EF 60 to 65% #5.  Spinal stenosis #6.  Status post appendectomy and hysterectomy #7.  Hypertension #8 history of adenomatous colon polyps-up-to-date with colonoscopy last done April 2021  Plan; Increase omeprazole to 40 mg p.o. twice daily AC breakfast  and AC dinner As Zofran has not been effective for nausea will start Phenergan 12.5 mg every 6 hours as needed for nausea Continue bland diet and push fluids-patient was given a copy of low gas diet Start Mycostatin oral suspension 5 cc swish and spit 4 times daily x2 weeks Gas-X 1-2 after meals as needed We will schedule for breath testing for SIBO Patient will also be scheduled for upper endoscopy with Dr. PHilarie Fredrickson  Procedure was discussed in detail with the patient today including indications risk and benefits and she is agreeable to proceed.  As of today's visit she is appropriate for endoscopic evaluation in the ambulatory care setting CBC with differential, c-Met  Further recommendations pending results of above    AAlfredia FergusonPA-C 12/20/2021   Cc: ABurnard Bunting MD

## 2022-01-01 NOTE — Progress Notes (Signed)
Addendum: Reviewed and agree with assessment and management plan. Shenequa Howse M, MD  

## 2022-01-04 ENCOUNTER — Encounter: Payer: Self-pay | Admitting: Internal Medicine

## 2022-01-04 ENCOUNTER — Ambulatory Visit (AMBULATORY_SURGERY_CENTER): Payer: PPO | Admitting: Internal Medicine

## 2022-01-04 VITALS — BP 106/64 | HR 73 | Temp 97.5°F | Resp 13 | Ht 62.0 in | Wt 218.0 lb

## 2022-01-04 DIAGNOSIS — K319 Disease of stomach and duodenum, unspecified: Secondary | ICD-10-CM | POA: Diagnosis not present

## 2022-01-04 DIAGNOSIS — K219 Gastro-esophageal reflux disease without esophagitis: Secondary | ICD-10-CM | POA: Diagnosis not present

## 2022-01-04 DIAGNOSIS — K2970 Gastritis, unspecified, without bleeding: Secondary | ICD-10-CM

## 2022-01-04 DIAGNOSIS — I10 Essential (primary) hypertension: Secondary | ICD-10-CM | POA: Diagnosis not present

## 2022-01-04 DIAGNOSIS — K2990 Gastroduodenitis, unspecified, without bleeding: Secondary | ICD-10-CM | POA: Diagnosis not present

## 2022-01-04 DIAGNOSIS — K2950 Unspecified chronic gastritis without bleeding: Secondary | ICD-10-CM | POA: Diagnosis not present

## 2022-01-04 DIAGNOSIS — B9681 Helicobacter pylori [H. pylori] as the cause of diseases classified elsewhere: Secondary | ICD-10-CM

## 2022-01-04 DIAGNOSIS — R110 Nausea: Secondary | ICD-10-CM

## 2022-01-04 DIAGNOSIS — R140 Abdominal distension (gaseous): Secondary | ICD-10-CM | POA: Diagnosis not present

## 2022-01-04 DIAGNOSIS — I509 Heart failure, unspecified: Secondary | ICD-10-CM | POA: Diagnosis not present

## 2022-01-04 DIAGNOSIS — K253 Acute gastric ulcer without hemorrhage or perforation: Secondary | ICD-10-CM

## 2022-01-04 MED ORDER — SODIUM CHLORIDE 0.9 % IV SOLN: 500.0000 mL | Freq: Once | INTRAVENOUS | Status: DC

## 2022-01-04 NOTE — Progress Notes (Signed)
See office note from [place] dated 12/20/2021 for details No significant changes in medical history since that time She remains appropriate.  Upper endoscopy in the [place] today  The nature of the procedure, as well as the risks, benefits, and alternatives were carefully and thoroughly reviewed with the patient. Ample time for discussion and questions allowed. The patient understood, was satisfied, and agreed to proceed.

## 2022-01-04 NOTE — Progress Notes (Signed)
Report given to PACU, vss 

## 2022-01-04 NOTE — Patient Instructions (Addendum)
Continue Omeprazole 40 mg 2 x per day  Submit SIBO Breath test  Office follow up   Repeat liver enzymes at office follow-up  YOU HAD AN ENDOSCOPIC PROCEDURE TODAY: Refer to the procedure report and other information in the discharge instructions given to you for any specific questions about what was found during the examination. If this information does not answer your questions, please call [place] office at [phone] to clarify.   YOU SHOULD EXPECT: Some feelings of bloating in the abdomen. Passage of more gas than usual. Walking can help get rid of the air that was put into your GI tract during the procedure and reduce the bloating. If you had a lower endoscopy (such as a colonoscopy or flexible sigmoidoscopy) you may notice spotting of blood in your stool or on the toilet paper. Some abdominal soreness may be present for a day or two, also.  DIET: Your first meal following the procedure should be a light meal and then it is ok to progress to your normal diet. A half-sandwich or bowl of soup is an example of a good first meal. Heavy or fried foods are harder to digest and may make you feel nauseous or bloated. Drink plenty of fluids but you should avoid alcoholic beverages for 24 hours. If you had a esophageal dilation, please see attached instructions for diet.    ACTIVITY: Your care partner should take you home directly after the procedure. You should plan to take it easy, moving slowly for the rest of the day. You can resume normal activity the day after the procedure however YOU SHOULD NOT DRIVE, use power tools, machinery or perform tasks that involve climbing or major physical exertion for 24 hours (because of the sedation medicines used during the test).   SYMPTOMS TO REPORT IMMEDIATELY: A gastroenterologist can be reached at any hour. Please call [phone]  for any of the following symptoms:  Following upper endoscopy (EGD, EUS, ERCP, esophageal dilation) Vomiting of blood or  coffee ground material  New, significant abdominal pain  New, significant chest pain or pain under the shoulder blades  Painful or persistently difficult swallowing  New shortness of breath  Black, tarry-looking or red, bloody stools  FOLLOW UP:  If any biopsies were taken you will be contacted by phone or by letter within the next 1-3 weeks. Call [phone]  if you have not heard about the biopsies in 3 weeks.  Please also call with any specific questions about appointments or follow up tests.

## 2022-01-04 NOTE — Progress Notes (Signed)
Called to room to assist during endoscopic procedure.  Patient ID and intended procedure confirmed with present staff. Received instructions for my participation in the procedure from the performing physician.  

## 2022-01-04 NOTE — Progress Notes (Signed)
0946 Robinul 0.1 mg IV given due large amount of secretions upon assessment.  MD made aware, vss  

## 2022-01-04 NOTE — Op Note (Signed)
[facility] Patient Name: Katherine Rush Procedure Date: 01/04/2022 9:37 AM MRN: [id number] Endoscopist: Jerene Bears , MD Age: 76 Referring MD:  Date of Birth: 06-08-1946 Gender: Female Account #: [id number] Procedure:                Upper GI endoscopy Indications:              Nausea, abdominal bloating, GERD history, COVID-19                            in Aug 2022, recently elevation in LFTs; some                            improvement with BID PPI (Zofran ineffective,                            promethazine more helpful and has needed this less                            over the last few days) Medicines:                Monitored Anesthesia Care Procedure:                Pre-Anesthesia Assessment:                           - Prior to the procedure, a History and Physical                            was performed, and patient medications and                            allergies were reviewed. The patient's tolerance of                            previous anesthesia was also reviewed. The risks                            and benefits of the procedure and the sedation                            options and risks were discussed with the patient.                            All questions were answered, and informed consent                            was obtained. Prior Anticoagulants: The patient has                            taken no previous anticoagulant or antiplatelet                            agents. ASA Grade Assessment: III - A patient with  severe systemic disease. After reviewing the risks                            and benefits, the patient was deemed in                            satisfactory condition to undergo the procedure.                           After obtaining informed consent, the endoscope was                            passed under direct vision. Throughout the                            procedure, the patient's blood  pressure, pulse, and                            oxygen saturations were monitored continuously. The                            GIF [id number] #[id number] was introduced through the                            mouth, and advanced to the second part of duodenum.                            The upper GI endoscopy was accomplished without                            difficulty. The patient tolerated the procedure                            well. Scope In: Scope Out: Findings:                 The examined esophagus was normal.                           Diffuse mild inflammation characterized by erythema                            and granularity was found in the gastric body and                            in the gastric antrum. Biopsies were taken with a                            cold forceps for histology and Helicobacter pylori                            testing.                           One non-bleeding superficial gastric ulcer was  found in the gastric antrum. The lesion was 5 mm in                            largest dimension. Biopsies were taken with a cold                            forceps for histology.                           The examined duodenum was normal. Biopsies were                            taken with a cold forceps for histology. Complications:            No immediate complications. Estimated Blood Loss:     Estimated blood loss was minimal. Impression:               - Normal esophagus.                           - Gastritis. Biopsied.                           - Small, non-bleeding gastric ulcer. Biopsied.                           - Normal examined duodenum. Biopsied. Recommendation:           - Patient has a contact number available for                            emergencies. The signs and symptoms of potential                            delayed complications were discussed with the                            patient. Return to normal activities  tomorrow.                            Written discharge instructions were provided to the                            patient.                           - Resume previous diet.                           - Continue present medications. Continue omeprazole                            40 mg BID until follow-up.                           - Await pathology results.                           -  Submit SIBO breath testing (patient has the kit                            already).                           - Repeat liver enzymes at office follow-up.                           - Office follow-up with me in March or early April. Jerene Bears, MD 01/04/2022 10:06:22 AM This report has been signed electronically.

## 2022-01-04 NOTE — Progress Notes (Signed)
Pt's states no medical or surgical changes since previsit or office visit.  ° °VS DT °

## 2022-01-05 ENCOUNTER — Other Ambulatory Visit: Payer: Self-pay

## 2022-01-05 DIAGNOSIS — R7989 Other specified abnormal findings of blood chemistry: Secondary | ICD-10-CM

## 2022-01-08 ENCOUNTER — Telehealth: Payer: Self-pay

## 2022-01-08 NOTE — Telephone Encounter (Signed)
°  Follow up Call-  Call back number 01/04/2022 03/18/2020  Post procedure Call Back phone  # [phone]  Permission to leave phone message Yes Yes  Some recent data might be hidden     Patient questions:  Do you have a fever, pain , or abdominal swelling? No. Pain Score  0 *  Have you tolerated food without any problems? Yes.    Have you been able to return to your normal activities? Yes.    Do you have any questions about your discharge instructions: Diet   No. Medications  No. Follow up visit  No.  Do you have questions or concerns about your Care? No.  Actions: * If pain score is 4 or above: No action needed, pain <4.  Have you developed a fever since your procedure? no  2.   Have you had an respiratory symptoms (SOB or cough) since your procedure? no  3.   Have you tested positive for COVID 19 since your procedure no  4.   Have you had any family members/close contacts diagnosed with the COVID 19 since your procedure?  no   If yes to any of these questions please route to Joylene John, RN and Joella Prince, RN

## 2022-01-09 ENCOUNTER — Encounter: Payer: Self-pay | Admitting: Physician Assistant

## 2022-01-09 DIAGNOSIS — R110 Nausea: Secondary | ICD-10-CM | POA: Diagnosis not present

## 2022-01-09 DIAGNOSIS — R1013 Epigastric pain: Secondary | ICD-10-CM | POA: Diagnosis not present

## 2022-01-09 DIAGNOSIS — R140 Abdominal distension (gaseous): Secondary | ICD-10-CM | POA: Diagnosis not present

## 2022-01-12 ENCOUNTER — Other Ambulatory Visit: Payer: Self-pay

## 2022-01-12 ENCOUNTER — Telehealth: Payer: Self-pay | Admitting: Internal Medicine

## 2022-01-12 DIAGNOSIS — A048 Other specified bacterial intestinal infections: Secondary | ICD-10-CM

## 2022-01-12 MED ORDER — PYLERA 140-125-125 MG PO CAPS: 3.0000 | ORAL_CAPSULE | Freq: Three times a day (TID) | ORAL | 0 refills | Status: DC

## 2022-01-12 NOTE — Telephone Encounter (Signed)
Patient returned your call asked that you call her back after 2:00 pm.

## 2022-01-12 NOTE — Telephone Encounter (Signed)
Spoke with pt. See surgical pathology result note from 01/09/22

## 2022-01-16 ENCOUNTER — Telehealth: Payer: Self-pay

## 2022-01-16 NOTE — Telephone Encounter (Signed)
Patient returned your call, please advise. 

## 2022-01-16 NOTE — Telephone Encounter (Signed)
Called to discuss with the patient. No answer. Left her a voicemail asking she call back.

## 2022-01-16 NOTE — Telephone Encounter (Signed)
-----   Message from Alfredia Ferguson, PA-C sent at 01/16/2022  8:32 AM EST ----- Regarding: Breath test result Breath Test has resulted for  SIBO - test is positive - I would like to give her a course of Xifaxan 550 mg po TID x 14 days .  Please ask her to call about a week after she completes the course and let us know is sxs improved /resolved  Please convert this to phone note so part of chart

## 2022-01-17 NOTE — Telephone Encounter (Signed)
What do you want to be the order of treatment for this patient. She is waiting for her PA on Pylera due to positive H Pylori biopsy..  The Xifaxan will likely require a PA as well. Do you want simultaneous treatment?

## 2022-01-17 NOTE — Telephone Encounter (Signed)
Patient notified of the plan to treat the H Pylori first. Wait 2 to 3 weeks, then treat SIBO.

## 2022-01-22 NOTE — Telephone Encounter (Signed)
Patient called requesting to speak with a nurse regarding what the status is for a medication to be given to her for the positive H pylori results.

## 2022-01-22 NOTE — Telephone Encounter (Signed)
Called [employer] and spoke with [place]. She stated that pylera is still pending insurance approval. Delana Meyer stated that someone would call insurance company to see why it is still pending and they would call me back. Called and spoke with pt to let her know we are still waiting on insurance to approve the medication and that we would be receiving a call from [employer]. Let pt know I would call her once I receive the call from [facility].

## 2022-01-22 NOTE — Telephone Encounter (Signed)
Have you heard anything from [place]?

## 2022-01-23 NOTE — Telephone Encounter (Addendum)
Called [facility] pharmacy and spoke with Olivia Mackie. She stated that Pylera was approved yesterday and the pt was contacted. Pt stated she couldn't wait 4 days for delivery and requested medication be sent to [facility] on [address]. [facility] sent prescription yesterday and [facility] ordered medication and stated they should have medication either today or tomorrow.

## 2022-01-29 ENCOUNTER — Other Ambulatory Visit: Payer: Self-pay

## 2022-01-29 ENCOUNTER — Telehealth: Payer: Self-pay | Admitting: Physician Assistant

## 2022-01-29 MED ORDER — PROMETHAZINE HCL 25 MG PO TABS: 12.5000 mg | ORAL_TABLET | Freq: Four times a day (QID) | ORAL | 1 refills | Status: DC | PRN

## 2022-01-29 NOTE — Telephone Encounter (Signed)
Noted  

## 2022-01-29 NOTE — Telephone Encounter (Signed)
See note below and advise. 

## 2022-01-29 NOTE — Telephone Encounter (Signed)
Pt also wanted to let us know that her urine is an orange color and her stools are dark. Discussed with pt that the the bismuth can cause the dark stools. She wanted to make sure that the urine color change was something to be concerned about. Please advise.

## 2022-01-29 NOTE — Telephone Encounter (Signed)
Esterwood, Amy S, PA-C  Chayton Murata, [employer], RN Caller: Unspecified (Today,  9:41 AM) Yes - nausea was the reason we did a work up on her , sorry the meds are making her feel worse!   I gave her Phenergan 12.5 mg q 6 hours when I saw her - we can refill that # 40 /1   Pt aware and prescription sent to pharmacy.

## 2022-01-29 NOTE — Telephone Encounter (Signed)
Inbound call from patient stating she has been experiencing nausea since taking H. Pylori medication and is wanting to know if there is something she can take to help with it.  Please advise.

## 2022-02-05 NOTE — Telephone Encounter (Signed)
Patient called and stated that she had finished  H. Pylori medication and would like to know the next steps moving forward. Please advise.

## 2022-02-05 NOTE — Telephone Encounter (Signed)
It is possible that H. pylori antibiotics would have also treated SIBO If her nausea and abdominal bloating is better I would recommend holding and not using the SIBO antibiotics at this time Reasonable about 4 weeks after H. pylori antibiotics to check stool antigen to confirm eradication If she is still having abdominal bloating, nausea and other symptoms which we associate with SIBO it would be reasonable to start SIBO antibiotics now. Let me know if she has questions

## 2022-02-05 NOTE — Telephone Encounter (Signed)
See note below. Pt has finished her meds for H pylori. She is to come in for stool specimen to make sure H pylori bacteria has cleared. She states she is also to be treated for SIBO but isn't sure if she needs to wait a certain amount of time following the H pylori tx to start the SIBO tx. Pt wanting to know what steps come next. Please advise.

## 2022-02-06 ENCOUNTER — Ambulatory Visit: Payer: PPO | Admitting: Internal Medicine

## 2022-02-06 NOTE — Telephone Encounter (Signed)
Spoke with pt and she is aware of Dr. Vena Rua recommendations. Pt states her nausea and bloating is better so she will hold on the SIBO antibiotic for now. Pt knows to come in 4 weeks for stool test to see if bacteria has cleared. She knows to hold omeprazole for 2 weeks prior to submitting stool specimen. Pts questions were answered.

## 2022-02-23 ENCOUNTER — Encounter: Payer: Self-pay | Admitting: *Deleted

## 2022-02-27 ENCOUNTER — Other Ambulatory Visit (INDEPENDENT_AMBULATORY_CARE_PROVIDER_SITE_OTHER): Payer: PPO

## 2022-02-27 DIAGNOSIS — R7989 Other specified abnormal findings of blood chemistry: Secondary | ICD-10-CM | POA: Diagnosis not present

## 2022-02-27 LAB — HEPATIC FUNCTION PANEL
ALT: 52 U/L — ABNORMAL HIGH (ref 0–35)
AST: 54 U/L — ABNORMAL HIGH (ref 0–37)
Albumin: 4 g/dL (ref 3.5–5.2)
Alkaline Phosphatase: 92 U/L (ref 39–117)
Bilirubin, Direct: 0.1 mg/dL (ref 0.0–0.3)
Total Bilirubin: 0.5 mg/dL (ref 0.2–1.2)
Total Protein: 6.9 g/dL (ref 6.0–8.3)

## 2022-03-05 ENCOUNTER — Other Ambulatory Visit: Payer: PPO

## 2022-03-05 DIAGNOSIS — A048 Other specified bacterial intestinal infections: Secondary | ICD-10-CM | POA: Diagnosis not present

## 2022-03-07 ENCOUNTER — Ambulatory Visit: Payer: PPO | Admitting: Internal Medicine

## 2022-03-07 ENCOUNTER — Other Ambulatory Visit (INDEPENDENT_AMBULATORY_CARE_PROVIDER_SITE_OTHER): Payer: PPO

## 2022-03-07 ENCOUNTER — Encounter: Payer: Self-pay | Admitting: Internal Medicine

## 2022-03-07 VITALS — BP 130/80 | HR 76 | Ht 64.0 in | Wt 211.0 lb

## 2022-03-07 DIAGNOSIS — R1013 Epigastric pain: Secondary | ICD-10-CM

## 2022-03-07 DIAGNOSIS — K5900 Constipation, unspecified: Secondary | ICD-10-CM | POA: Diagnosis not present

## 2022-03-07 DIAGNOSIS — B9681 Helicobacter pylori [H. pylori] as the cause of diseases classified elsewhere: Secondary | ICD-10-CM | POA: Diagnosis not present

## 2022-03-07 DIAGNOSIS — D509 Iron deficiency anemia, unspecified: Secondary | ICD-10-CM

## 2022-03-07 DIAGNOSIS — K6389 Other specified diseases of intestine: Secondary | ICD-10-CM | POA: Diagnosis not present

## 2022-03-07 DIAGNOSIS — R140 Abdominal distension (gaseous): Secondary | ICD-10-CM

## 2022-03-07 DIAGNOSIS — K2970 Gastritis, unspecified, without bleeding: Secondary | ICD-10-CM | POA: Diagnosis not present

## 2022-03-07 DIAGNOSIS — K760 Fatty (change of) liver, not elsewhere classified: Secondary | ICD-10-CM

## 2022-03-07 DIAGNOSIS — Z8619 Personal history of other infectious and parasitic diseases: Secondary | ICD-10-CM

## 2022-03-07 LAB — H. PYLORI ANTIGEN, STOOL: H pylori Ag, Stl: NEGATIVE

## 2022-03-07 LAB — VITAMIN D 25 HYDROXY (VIT D DEFICIENCY, FRACTURES): VITD: 60.8 ng/mL (ref 30.00–100.00)

## 2022-03-07 LAB — VITAMIN B12: Vitamin B-12: 195 pg/mL — ABNORMAL LOW (ref 211–911)

## 2022-03-07 NOTE — Progress Notes (Signed)
? ?Subjective:  ? ? Patient ID: Katherine Rush, female    DOB: 18-Aug-1946, 76 y.o.   MRN: [id number] ? ?HPI ?Katherine Rush is a 76 year old with chronic GERD, recent diagnosis of gastritis with shallow gastric ulcer and H. pylori treated with Pylera, SIBO (positive breath test), NAFLD, history of colon polyps who is here for follow-up.  She is here alone today.  She was last here for upper endoscopy on 01/04/2022. ? ?Upper endoscopy showed diffuse gastritis and a nonbleeding superficial gastric ulcer in the antrum.  The esophagus and examined duodenum were normal.  Biopsies showed chronic active gastritis with H. pylori.  Duodenal biopsies were normal. ? ?She was treated with Pylera which caused GI upset.  She reports that "I could have died and felt better" but she was able to complete the Pylera with the help of promethazine.  Earlier this week she submitted an H. pylori stool antigen but this is pending.  She has been off of her omeprazole since treatment.  Her nausea is significantly and nearly 100% better.  However she still having gnawing and burning epigastric pain.  This is worse when her stomach is empty.  She has continued probiotic.  Her bowel movements have been less frequent and she is feeling more constipated.  Her last bowel movement was Sunday.  She typically has a bowel movement every 4 to 5 days.  She continues to have a terrible not exactly metallic taste in her mouth which changes the taste of food and water.  This has been prevalent since her COVID infection last year.  Since December 6 she is lost 11 pounds, mostly intentionally. ? ? ?Review of Systems ?As per HPI, otherwise negative ? ?Current Medications, Allergies, Past Medical History, Past Surgical History, Family History and Social History were reviewed in [employer] record. ?   ?Objective:  ? Physical Exam ?BP 130/80   Pulse 76   Ht '5\' 4"'$  (1.626 m)   Wt 211 lb (95.7 kg)   BMI 36.22 kg/m?  ?Gen: awake,  alert, NAD ?HEENT: anicteric  ?CV: RRR, no mrg ?Pulm: CTA b/l ?Abd: soft, obese, NT/ND, +BS throughout ?Ext: no c/c/e ?Neuro: nonfocal ? ?CMP  ?   ?Component Value Date/Time  ? NA 140 12/20/2021 1059  ? NA 142 09/28/2021 1409  ? K 4.8 12/20/2021 1059  ? CL 104 12/20/2021 1059  ? CO2 28 12/20/2021 1059  ? GLUCOSE 86 12/20/2021 1059  ? BUN 37 (H) 12/20/2021 1059  ? BUN 33 (H) 09/28/2021 1409  ? CREATININE 1.95 (H) 12/20/2021 1059  ? CALCIUM 10.0 12/20/2021 1059  ? PROT 6.9 02/27/2022 1507  ? ALBUMIN 4.0 02/27/2022 1507  ? AST 54 (H) 02/27/2022 1507  ? ALT 52 (H) 02/27/2022 1507  ? ALKPHOS 92 02/27/2022 1507  ? BILITOT 0.5 02/27/2022 1507  ? GFRNONAA 31 (L) 11/01/2020 1538  ? GFRAA 36 (L) 11/01/2020 1538  ? ? ?  Latest Ref Rng & Units 12/20/2021  ? 10:59 AM 09/20/2006  ?  8:47 AM 06/07/2006  ? 10:22 AM  ?CBC  ?WBC 4.0 - 10.5 K/uL 9.8   8.2   8.3    ?Hemoglobin 12.0 - 15.0 g/dL 13.5   14.6   14.6    ?Hematocrit 36.0 - 46.0 % 41.1   42.6   41.8    ?Platelets 150.0 - 400.0 K/uL 204.0   276   298    ? ? ? ?   ?Assessment & Plan:  ?  76 year old with chronic GERD, recent diagnosis of gastritis with shallow gastric ulcer and H. pylori treated with Pylera, SIBO (positive breath test), NAFLD, history of colon polyps who is here for follow-up. ? ?H. pylori gastritis --biopsy-proven; she completed Pylera ?--Await H. pylori stool antigen to confirm eradication ? ?2.  History of GERD/ongoing gnawing and burning epigastric pain --possibly some persistent gastritis. ?--Resume omeprazole 40 mg once daily; she has permission to increase this to 40 mg twice daily AC if this pain does not improve ?--We will have her stop probiotic ? ?3.  Mild constipation --begin Metamucil 2 teaspoons daily.  Can increase to twice daily.  If ineffective add MiraLAX 17 g daily ? ?4.  NAFLD --liver enzymes remain mildly elevated.  We discussed this today.  No evidence of advanced liver disease or cirrhosis however we discussed that nonalcoholic  steatohepatitis can lead to cirrhosis in some patients.  We would like to try to get the inflammation under control through further weight loss, goal weight loss 22 pounds which would be 10% of her body weight.  She is halfway there.  Discussed the importance of exercise and healthy diet.  Also controlling risk factors including blood pressure, blood sugar and cholesterol/triglyceride. ?-- Begin vitamin E 800 IU daily ?--Risk factor modification as above; continue towards weight loss goal ?--Follow liver enzymes and consider elastography in 6 to 12 months ? ?5.  History of vitamin D deficiency -she has been off of vitamin D for a month or 2.  We will check a level. ?--Serum vitamin D today ? ?6. SIBO --positive by breath test but she is now taking antibiotics for Pylera.  I told her that these antibiotics may have treated successfully her SIBO.  We are going to treat issues as discussed in #2 with resumption of PPI.  We can consider rifaximin for persistent symptoms ? ?Follow-up with me in June ? ?40 minutes total spent today including patient facing time, coordination of care, reviewing medical history/procedures/pertinent radiology studies, and documentation of the encounter. ? ?

## 2022-03-07 NOTE — Patient Instructions (Addendum)
Your provider has requested that you go to the basement level for lab work before leaving today. Press "B" on the elevator. The lab is located at the first door on the left as you exit the elevator. ? ? ?Stop Probiotics.  ? ?Start Fiber powder. If Fiber is not helping then you may change to Miralax 17 grams(1 capful) once daily . ? ?Start Vitamin E 800 iu- Take once daily.  ? ?Resume Omeprazole '40mg'$  -once daily. If once daily dosing is not helping, you may increase to twice daily.  ? ?Follow up on : 05/24/22 at 10:10am ? ?Thank you for choosing me and [facility]. ? ?Dr.Jay Pyrtle  ? ?

## 2022-03-08 ENCOUNTER — Other Ambulatory Visit: Payer: Self-pay

## 2022-03-08 DIAGNOSIS — E538 Deficiency of other specified B group vitamins: Secondary | ICD-10-CM

## 2022-03-08 MED ORDER — VITAMIN B 12 500 MCG PO TABS: 1000.0000 ug | ORAL_TABLET | Freq: Every day | ORAL | Status: AC

## 2022-03-19 DIAGNOSIS — M79674 Pain in right toe(s): Secondary | ICD-10-CM | POA: Diagnosis not present

## 2022-03-19 DIAGNOSIS — A048 Other specified bacterial intestinal infections: Secondary | ICD-10-CM | POA: Diagnosis not present

## 2022-03-19 DIAGNOSIS — K760 Fatty (change of) liver, not elsewhere classified: Secondary | ICD-10-CM | POA: Diagnosis not present

## 2022-03-19 DIAGNOSIS — I5032 Chronic diastolic (congestive) heart failure: Secondary | ICD-10-CM | POA: Diagnosis not present

## 2022-03-19 DIAGNOSIS — K219 Gastro-esophageal reflux disease without esophagitis: Secondary | ICD-10-CM | POA: Diagnosis not present

## 2022-03-19 DIAGNOSIS — I129 Hypertensive chronic kidney disease with stage 1 through stage 4 chronic kidney disease, or unspecified chronic kidney disease: Secondary | ICD-10-CM | POA: Diagnosis not present

## 2022-03-21 ENCOUNTER — Telehealth: Payer: Self-pay | Admitting: Internal Medicine

## 2022-03-21 MED ORDER — OMEPRAZOLE 40 MG PO CPDR: 40.0000 mg | DELAYED_RELEASE_CAPSULE | Freq: Two times a day (BID) | ORAL | 3 refills | Status: DC

## 2022-03-21 NOTE — Telephone Encounter (Signed)
Inbound call from patient would like a medication refill for omeprazole '40mg'$  sent to [facility]. ?

## 2022-03-21 NOTE — Telephone Encounter (Signed)
Refills sent to patient's pharmacy.

## 2022-05-03 ENCOUNTER — Telehealth: Payer: Self-pay | Admitting: Internal Medicine

## 2022-05-03 ENCOUNTER — Other Ambulatory Visit (INDEPENDENT_AMBULATORY_CARE_PROVIDER_SITE_OTHER): Payer: PPO

## 2022-05-03 DIAGNOSIS — E538 Deficiency of other specified B group vitamins: Secondary | ICD-10-CM | POA: Diagnosis not present

## 2022-05-03 LAB — VITAMIN B12: Vitamin B-12: 683 pg/mL (ref 211–911)

## 2022-05-03 NOTE — Telephone Encounter (Signed)
Patient returned your phone call.  She wanted to let you know she would be going to get her labs done within the hour.  If you needed her for anything else, please call her back.  Thank you.

## 2022-05-09 DIAGNOSIS — E785 Hyperlipidemia, unspecified: Secondary | ICD-10-CM | POA: Diagnosis not present

## 2022-05-09 DIAGNOSIS — I129 Hypertensive chronic kidney disease with stage 1 through stage 4 chronic kidney disease, or unspecified chronic kidney disease: Secondary | ICD-10-CM | POA: Diagnosis not present

## 2022-05-09 DIAGNOSIS — N1832 Chronic kidney disease, stage 3b: Secondary | ICD-10-CM | POA: Diagnosis not present

## 2022-05-23 DIAGNOSIS — K760 Fatty (change of) liver, not elsewhere classified: Secondary | ICD-10-CM | POA: Diagnosis not present

## 2022-05-23 DIAGNOSIS — I129 Hypertensive chronic kidney disease with stage 1 through stage 4 chronic kidney disease, or unspecified chronic kidney disease: Secondary | ICD-10-CM | POA: Diagnosis not present

## 2022-05-23 DIAGNOSIS — N1832 Chronic kidney disease, stage 3b: Secondary | ICD-10-CM | POA: Diagnosis not present

## 2022-05-23 DIAGNOSIS — M1A49X0 Other secondary chronic gout, multiple sites, without tophus (tophi): Secondary | ICD-10-CM | POA: Diagnosis not present

## 2022-05-24 ENCOUNTER — Ambulatory Visit: Payer: PPO | Admitting: Internal Medicine

## 2022-05-24 ENCOUNTER — Encounter: Payer: Self-pay | Admitting: Internal Medicine

## 2022-05-24 ENCOUNTER — Other Ambulatory Visit (INDEPENDENT_AMBULATORY_CARE_PROVIDER_SITE_OTHER): Payer: PPO

## 2022-05-24 VITALS — BP 122/70 | HR 72 | Ht 63.5 in | Wt 214.5 lb

## 2022-05-24 DIAGNOSIS — B9681 Helicobacter pylori [H. pylori] as the cause of diseases classified elsewhere: Secondary | ICD-10-CM | POA: Diagnosis not present

## 2022-05-24 DIAGNOSIS — R1013 Epigastric pain: Secondary | ICD-10-CM | POA: Diagnosis not present

## 2022-05-24 DIAGNOSIS — E538 Deficiency of other specified B group vitamins: Secondary | ICD-10-CM

## 2022-05-24 DIAGNOSIS — K760 Fatty (change of) liver, not elsewhere classified: Secondary | ICD-10-CM

## 2022-05-24 DIAGNOSIS — K2970 Gastritis, unspecified, without bleeding: Secondary | ICD-10-CM

## 2022-05-24 LAB — HEPATIC FUNCTION PANEL
ALT: 47 U/L — ABNORMAL HIGH (ref 0–35)
AST: 62 U/L — ABNORMAL HIGH (ref 0–37)
Albumin: 3.9 g/dL (ref 3.5–5.2)
Alkaline Phosphatase: 93 U/L (ref 39–117)
Bilirubin, Direct: 0.1 mg/dL (ref 0.0–0.3)
Total Bilirubin: 0.5 mg/dL (ref 0.2–1.2)
Total Protein: 6.7 g/dL (ref 6.0–8.3)

## 2022-05-24 NOTE — Patient Instructions (Signed)
Your provider has requested that you go to the basement level for lab work before leaving today. Press "B" on the elevator. The lab is located at the first door on the left as you exit the elevator.  Continue omeprazole.  Continue Vitamin E.  Continue B12.  Continue Metamucil.  Please follow up with Dr Hilarie Fredrickson in 6-9 months.  If you are age 76 or older, your body mass index should be between 23-30. Your Body mass index is 37.4 kg/m. If this is out of the aforementioned range listed, please consider follow up with your Primary Care Provider.  If you are age 50 or younger, your body mass index should be between 19-25. Your Body mass index is 37.4 kg/m. If this is out of the aformentioned range listed, please consider follow up with your Primary Care Provider.   ________________________________________________________  The  GI providers would like to encourage you to use [facility] to communicate with providers for non-urgent requests or questions.  Due to long hold times on the telephone, sending your provider a message by [facility] may be a faster and more efficient way to get a response.  Please allow 48 business hours for a response.  Please remember that this is for non-urgent requests.  _______________________________________________________  Due to recent changes in healthcare laws, you may see the results of your imaging and laboratory studies on MyChart before your provider has had a chance to review them.  We understand that in some cases there may be results that are confusing or concerning to you. Not all laboratory results come back in the same time frame and the provider may be waiting for multiple results in order to interpret others.  Please give [place] 48 hours in order for your provider to thoroughly review all the results before contacting the office for clarification of your results.

## 2022-05-24 NOTE — Progress Notes (Signed)
Subjective:    Patient ID: Katherine Rush, female    DOB: 1946/11/03, 76 y.o.   MRN: [id number]  HPI [name] is a 76 year old with chronic GERD, recent diagnosis of gastritis with shallow gastric ulcer and H. pylori treated with Pylera, SIBO (positive breath test), NAFLD, history of colon polyps who is here for follow-up.  She is here alone today and was last seen on 03/07/2022.  After her last visit she had an H. pylori stool antigen off of PPI which was negative.  We started her back on omeprazole.  We started her on Vitamin E 800 IU daily.  We also started Metamucil.  She reports she is feeling well.  Omeprazole was definitively helpful for her indigestion as well as gnawing upper abdominal discomfort.  She has remained off of probiotic.  She started Metamucil Gummies and this significantly helped her constipation.  She is tolerating Vitamin E 800 IU daily.  She does note that she has some mild nausea if she does not eat but eating relieves this quickly.  She is taking her B12.  She has had issues with gout of late and was started on colchicine.  This led to abdominal upset and diarrhea.  She is going to be started on a renal dose of allopurinol likely in the coming days.   Review of Systems As per HPI, otherwise negative  Current Medications, Allergies, Past Medical History, Past Surgical History, Family History and Social History were reviewed in [employer] record.     Objective:   Physical Exam BP 122/70   Pulse 72   Ht 5' 3.5" (1.613 m)   Wt 214 lb 8 oz (97.3 kg)   BMI 37.40 kg/m  Gen: awake, alert, NAD HEENT: anicteric Neuro: nonfocal     Assessment & Plan:  76 year old with chronic GERD, recent diagnosis of gastritis with shallow gastric ulcer and H. pylori treated with Pylera, SIBO (positive breath test), NAFLD, B12 deficiency history of colon polyps who is here for follow-up.  H. pylori gastritis with small ulcer --dysplasia  negative, H. pylori positive but confirmed eradication with stool antigen off PPI.  Reassurance provided that this has been cured.  2.  GERD and dyspepsia --she has certainly done better once we added back omeprazole.  She will continue 40 mg daily.  She had some minor stomach upset related to colchicine and use this twice daily during that period which I am okay with. --Omeprazole 40 mg once daily, okay to use this twice daily AC if needed on rare occasion --She will remain off probiotic  3.  Mild constipation --Metamucil Gummies effective.  Continue.  4.  NAFLD --she has started vitamin E 800 IU daily.  Overall goal would be 10% weight reduction in the next 12 months.  For her this would be 20 pounds.  Continue risk factor modification with primary care and controlling blood pressure, blood sugar and cholesterol. --Continue Vit E 800 IU daily --Hepatic function panel today --Consider elastography in 6 to 12 months  5.  B12 deficiency --improved significantly with oral supplementation --Continue B12 1000 mcg daily  6.  SIBO --positive breath test but symptoms improved with Pylera antibiotics.  7.  Gout --treated with colchicine which definitively has a known side effect of diarrhea.  She is transitioning to allopurinol which hopefully will prevent gout flares in the future.  76 to 64-monthfollow-up, sooner if needed  30 minutes total spent today including patient facing  time, coordination of care, reviewing medical history/procedures/pertinent radiology studies, and documentation of the encounter.

## 2022-06-08 ENCOUNTER — Ambulatory Visit: Payer: PPO | Admitting: Cardiovascular Disease

## 2022-06-08 ENCOUNTER — Encounter: Payer: Self-pay | Admitting: Cardiovascular Disease

## 2022-06-08 VITALS — BP 124/76 | HR 79 | Ht 63.5 in | Wt 215.8 lb

## 2022-06-08 DIAGNOSIS — I5032 Chronic diastolic (congestive) heart failure: Secondary | ICD-10-CM | POA: Diagnosis not present

## 2022-06-08 DIAGNOSIS — E6609 Other obesity due to excess calories: Secondary | ICD-10-CM | POA: Diagnosis not present

## 2022-06-08 NOTE — Progress Notes (Signed)
Cardiology Office Note:    Date:  06/08/2022   ID:  [name], DOB 12/23/1945, MRN [id number]  PCP:  Burnard Bunting, MD  [facility] Cardiologist:  [facility] Electrophysiologist:  None   Referring MD: Burnard Bunting, MD   Chief Complaint  Patient presents with   Hypertension        Congestive Heart Failure         Nov. 23, 2021   Katherine Rush is a 76 y.o. female with a hx of HTN, HLD. We were asked to see her today by Dr. Reynaldo Minium for further evaluation and management of her HTN. She was recently seen by the NP at [facility]   Has been having some chest discomfort recently  Pressure,  Usually during the night when she is lying down  Sleeps in a recliner due to back issues + family hx of cardiac disease   Does not have CP with exertion, but does have DOE .    Eating some salt in her diet ,  Does not watch her diet since her mother passed away   Does not get any regular exercise ,  Has been caring for her mother who recently passed away after 4 years of full time care .   February 13, 2021;  Katherine Rush is a 76 yo with hx of HTN, morbid obesity, HLD . Chronic leg edema . Echo from Dec. 2021 shows normal LV systolic function witih grade  1 DD BP is better  Is on a diet .  But is gaining weight   She had her labs repeat at [facility]  - she did not get a call   She cannot access the patient portal so she does not know those results   June 08, 2022: Katherine Rush is seen today for follow-up of her hypertension, chronic diastolic congestive heart failure, morbid obesity, hyperlipidemia.  She has chronic leg edema. Wt today is 215 lbs . ( Down 4 lbs from last year )   She was seen by Richardson Dopp in October and November 2022.  She was having some symptoms of volume excess.  He stopped her chlorthalidone and started Lasix.  This resulted in an increase in her creatinine.  The Lasix dose was further decreased.  Stress Myoview study was  normal.  There was no evidence of ischemia and normal LV systolic function.  Creatinine is around 1.95.  She still has DOE  Still eating salt on occasion  Has H . Pylori and COVID several months ago  Breakfast - waffles wit blueberries or cereal Lunch - sandwich, or meat , veggie  Dinner - yogart, granola       Past Medical History:  Diagnosis Date   Allergic rhinitis    Gastroesophageal reflux disease    Gout    Helicobacter pylori gastritis    History of colon polyps    Hyperlipidemia    Hypertension    IBS (irritable bowel syndrome)    Internal hemorrhoids    Lexiscan Myoview 09/2021    Myoview 10/22: no ischemia, EF 86, low risk   Non-alcoholic fatty liver disease    Spinal stenosis of lumbar region    Tubular adenoma of colon     Past Surgical History:  Procedure Laterality Date   [place]   right   CATARACT EXTRACTION  07/2011   TOTAL ABDOMINAL HYSTERECTOMY W/ BILATERAL SALPINGOOPHORECTOMY  1990    Current Medications: Current Meds  Medication Sig   acetaminophen (TYLENOL) 500 MG tablet Take 500 mg by mouth every 6 (six) hours as needed.   allopurinol (ZYLOPRIM) 100 MG tablet Take 100 mg by mouth daily.   carvedilol (COREG) 6.25 MG tablet TAKE 1 TABLET BY MOUTH TWICE DAILY   colchicine 0.6 MG tablet 0.3 mg.   Cyanocobalamin (VITAMIN B 12) 500 MCG TABS Take 1,000 mcg by mouth daily.   furosemide (LASIX) 40 MG tablet Take 0.5 tablets (20 mg total) by mouth daily.   olmesartan (BENICAR) 40 MG tablet Take 40 mg by mouth daily.   omeprazole (PRILOSEC) 40 MG capsule Take 40 mg by mouth daily.   ondansetron (ZOFRAN) 4 MG tablet Take 4 mg by mouth every 8 (eight) hours as needed for nausea or vomiting.   promethazine (PHENERGAN) 25 MG tablet Take 0.5 tablets (12.5 mg total) by mouth every 6 (six) hours as needed for nausea or vomiting.   Vitamin E 400 units TABS Take 2 tablets by mouth in the morning.     Allergies:   Fish  allergy, Shellfish-derived products, Iodine, Ivp dye [iodinated contrast media], Other, Penicillins, and Sulfonamide derivatives   Social History   Socioeconomic History   Marital status: Single    Spouse name: Not on file   Number of children: Not on file   Years of education: Not on file   Highest education level: Not on file  Occupational History   Not on file  Tobacco Use   Smoking status: Never   Smokeless tobacco: Never  Vaping Use   Vaping Use: Never used  Substance and Sexual Activity   Alcohol use: No    Alcohol/week: 0.0 standard drinks of alcohol   Drug use: No   Sexual activity: Not on file  Other Topics Concern   Not on file  Social History Narrative   Not on file   Social Determinants of Health   Financial Resource Strain: Not on file  Food Insecurity: Not on file  Transportation Needs: Not on file  Physical Activity: Not on file  Stress: Not on file  Social Connections: Not on file     Family History: The patient's family history includes Breast cancer in her cousin and maternal aunt; Cancer in her maternal aunt; Diabetes in her mother; Heart attack in her father, maternal grandfather, and mother; Other in an other family member; Stroke in her maternal grandmother and mother; Throat cancer in her cousin. There is no history of Colon cancer.  ROS:   Please see the history of present illness.     All other systems reviewed and are negative.  EKGs/Labs/Other Studies Reviewed:    The following studies were reviewed today:     Recent Labs: 09/12/2021: NT-Pro BNP 65 12/20/2021: BUN 37; Creatinine, Ser 1.95; Hemoglobin 13.5; Platelets 204.0; Potassium 4.8; Sodium 140 05/24/2022: ALT 47  Recent Lipid Panel No results found for: "CHOL", "TRIG", "HDL", "CHOLHDL", "VLDL", "LDLCALC", "LDLDIRECT"   Risk Assessment/Calculations:       Physical Exam:    Physical Exam: Blood pressure 124/76, pulse 79, height 5' 3.5" (1.613 m), weight 215 lb 12.8 oz (97.9  kg), SpO2 94 %.  GEN:  Well nourished, well developed in no acute distress HEENT: Normal NECK: No JVD; No carotid bruits LYMPHATICS: No lymphadenopathy CARDIAC: RRR , no murmurs, rubs, gallops RESPIRATORY:  Clear to auscultation without rales, wheezing or rhonchi  ABDOMEN: Soft, non-tender, non-distended MUSCULOSKELETAL:  No edema; No deformity  SKIN: Warm and dry NEUROLOGIC:  Alert and  oriented x 3  EKG:    June 08, 2022:   NSR   ASSESSMENT:    1. Obesity due to excess calories without serious comorbidity, unspecified classification   2. Chronic diastolic heart failure (HCC)    PLAN:    Chronic diastolic congestive heart failure: She has grade 1 diastolic dysfunction.  Have reminded her to watch her salt intake.  Continue current dose of Lasix.  We could consider spironolactone but and worried that this might result in worsening creatinine.  For now we will continue as she is.  She will try to work on weight loss.  Hypertension:  BP looks great    2.  Leg cramps:   3.  Obesity:    advised better diet, exercise   4.  Leg edema:  .   Shared Decision Making/Informed Consent        Medication Adjustments/Labs and Tests Ordered: Current medicines are reviewed at length with the patient today.  Concerns regarding medicines are outlined above.  Orders Placed This Encounter  Procedures   EKG 12-Lead   No orders of the defined types were placed in this encounter.   Patient Instructions  Medication Instructions:  Your physician recommends that you continue on your current medications as directed. Please refer to the Current Medication list given to you today.  *If you need a refill on your cardiac medications before your next appointment, please call your pharmacy*   Lab Work: NONE If you have labs (blood work) drawn today and your tests are completely normal, you will receive your results only by: [place] (if you have MyChart) OR A paper copy in the  mail If you have any lab test that is abnormal or we need to change your treatment, we will call you to review the results.   Testing/Procedures: NONE   Follow-Up: At [facility], you and your health needs are our priority.  As part of our continuing mission to provide you with exceptional heart care, we have created designated Provider Care Teams.  These Care Teams include your primary Cardiologist (physician) and Advanced Practice Providers (APPs -  Physician Assistants and Nurse Practitioners) who all work together to provide you with the care you need, when you need it.  Your next appointment:   6 month(s)  The format for your next appointment:   In Person  Provider:   Richardson Dopp, PA {  Important Information About Sugar         Signed, Mertie Moores, MD  06/08/2022 5:40 PM    [place]

## 2022-06-08 NOTE — Patient Instructions (Signed)
Medication Instructions:  Your physician recommends that you continue on your current medications as directed. Please refer to the Current Medication list given to you today.  *If you need a refill on your cardiac medications before your next appointment, please call your pharmacy*   Lab Work: NONE If you have labs (blood work) drawn today and your tests are completely normal, you will receive your results only by: [place] (if you have MyChart) OR A paper copy in the mail If you have any lab test that is abnormal or we need to change your treatment, we will call you to review the results.   Testing/Procedures: NONE   Follow-Up: At [facility], you and your health needs are our priority.  As part of our continuing mission to provide you with exceptional heart care, we have created designated Provider Care Teams.  These Care Teams include your primary Cardiologist (physician) and Advanced Practice Providers (APPs -  Physician Assistants and Nurse Practitioners) who all work together to provide you with the care you need, when you need it.  Your next appointment:   6 month(s)  The format for your next appointment:   In Person  Provider:   Richardson Dopp, PA {  Important Information About Sugar

## 2022-07-16 DIAGNOSIS — I10 Essential (primary) hypertension: Secondary | ICD-10-CM | POA: Diagnosis not present

## 2022-07-16 DIAGNOSIS — R7989 Other specified abnormal findings of blood chemistry: Secondary | ICD-10-CM | POA: Diagnosis not present

## 2022-07-16 DIAGNOSIS — E785 Hyperlipidemia, unspecified: Secondary | ICD-10-CM | POA: Diagnosis not present

## 2022-07-16 DIAGNOSIS — N1832 Chronic kidney disease, stage 3b: Secondary | ICD-10-CM | POA: Diagnosis not present

## 2022-07-23 DIAGNOSIS — I129 Hypertensive chronic kidney disease with stage 1 through stage 4 chronic kidney disease, or unspecified chronic kidney disease: Secondary | ICD-10-CM | POA: Diagnosis not present

## 2022-07-23 DIAGNOSIS — N1832 Chronic kidney disease, stage 3b: Secondary | ICD-10-CM | POA: Diagnosis not present

## 2022-07-23 DIAGNOSIS — R82998 Other abnormal findings in urine: Secondary | ICD-10-CM | POA: Diagnosis not present

## 2022-07-23 DIAGNOSIS — A048 Other specified bacterial intestinal infections: Secondary | ICD-10-CM | POA: Diagnosis not present

## 2022-07-23 DIAGNOSIS — Z0000 Encounter for general adult medical examination without abnormal findings: Secondary | ICD-10-CM | POA: Diagnosis not present

## 2022-07-23 DIAGNOSIS — Z1339 Encounter for screening examination for other mental health and behavioral disorders: Secondary | ICD-10-CM | POA: Diagnosis not present

## 2022-07-23 DIAGNOSIS — E785 Hyperlipidemia, unspecified: Secondary | ICD-10-CM | POA: Diagnosis not present

## 2022-07-23 DIAGNOSIS — Z1331 Encounter for screening for depression: Secondary | ICD-10-CM | POA: Diagnosis not present

## 2022-08-20 ENCOUNTER — Other Ambulatory Visit: Payer: Self-pay | Admitting: Cardiovascular Disease

## 2022-11-02 ENCOUNTER — Other Ambulatory Visit: Payer: Self-pay | Admitting: Internal Medicine

## 2022-11-02 ENCOUNTER — Other Ambulatory Visit: Payer: Self-pay | Admitting: Physician Assistant

## 2022-11-13 NOTE — Progress Notes (Unsigned)
Cardiology Office Note:    Date:  11/14/2022   ID:  Katherine Rush, DOB 1946-04-03, MRN [id number]  PCP:  Burnard Bunting, MD  [facility] Cardiologist:  Mertie Moores, MD Cardiology APP:  Sharmon Revere    Referring MD: Burnard Bunting, MD   Chief Complaint:  F/u for CHF    Patient Profile: (HFpEF) heart failure with preserved ejection fraction  Echocardiogram 11/17/20: EF 60-65, Gr 1 DD, mild LVH, normal RVSF, trivial MR, AV sclerosis without stenosis, RAP 3 Hypertension  Hyperlipidemia  Chronic kidney disease  Myoview 10/22: EF 86, no ischemia Chronic back pain due to spinal stenosis + DDD FHx of CAD  Morbid obesity  Chronic leg edema  GERD Hepatic steatosis (CT in 9/21) Aortic atherosclerosis   Cardiac Studies & Procedures     STRESS TESTS  MYOCARDIAL PERFUSION IMAGING 09/21/2021  Narrative   The study is normal. The study is low risk.   No ST deviation was noted.   LV perfusion is normal. There is no evidence of ischemia. There is no evidence of infarction.   Left ventricular function is normal. Nuclear stress EF: 86 %. The left ventricular ejection fraction is hyperdynamic (>65%). End diastolic cavity size is normal.   Prior study not available for comparison.   ECHOCARDIOGRAM  ECHOCARDIOGRAM COMPLETE 11/14/2020  Narrative ECHOCARDIOGRAM REPORT    Patient Name:   Katherine Rush Date of Exam: 11/14/2020 Medical Rec #:  [id number]         Height:       62.0 in Accession #:    [id number]        Weight:       208.6 lb Date of Birth:  1946-08-28        BSA:          1.946 m Patient Age:    27 years          BP:           138/68 mmHg Patient Gender: F                 HR:           72 bpm. Exam Location:  [address]  Procedure: 2D Echo, Cardiac Doppler, Color Doppler and Intracardiac Opacification Agent  Indications:    R00.0 Tachycardia  History:        Patient has no prior history of Echocardiogram  examinations. Risk Factors:Hypertension and Dyslipidemia. Lower extremity edema.  Sonographer:    Diamond Nickel RCS Referring Phys: [place]   1. Left ventricular ejection fraction, by estimation, is 60 to 65%. The left ventricle has normal function. The left ventricle has no regional wall motion abnormalities. There is mild left ventricular hypertrophy. Left ventricular diastolic parameters are consistent with Grade I diastolic dysfunction (impaired relaxation). 2. Right ventricular systolic function is normal. The right ventricular size is normal. Tricuspid regurgitation signal is inadequate for assessing PA pressure. 3. The mitral valve is normal in structure. Trivial mitral valve regurgitation. No evidence of mitral stenosis. 4. The aortic valve was not well visualized. Aortic valve regurgitation is not visualized. Mild aortic valve sclerosis is present, with no evidence of aortic valve stenosis. 5. The inferior vena cava is normal in size with greater than 50% respiratory variability, suggesting right atrial pressure of 3 mmHg.  FINDINGS Left Ventricle: Left ventricular ejection fraction, by estimation, is 60 to 65%. The left ventricle has normal function. The left ventricle has  no regional wall motion abnormalities. The left ventricular internal cavity size was normal in size. There is mild left ventricular hypertrophy. Left ventricular diastolic parameters are consistent with Grade I diastolic dysfunction (impaired relaxation).  Right Ventricle: The right ventricular size is normal. No increase in right ventricular wall thickness. Right ventricular systolic function is normal. Tricuspid regurgitation signal is inadequate for assessing PA pressure.  Left Atrium: Left atrial size was normal in size.  Right Atrium: Right atrial size was normal in size.  Pericardium: Trivial pericardial effusion is present.  Mitral Valve: The mitral valve is normal in  structure. Mild mitral annular calcification. Trivial mitral valve regurgitation. No evidence of mitral valve stenosis.  Tricuspid Valve: The tricuspid valve is normal in structure. Tricuspid valve regurgitation is not demonstrated.  Aortic Valve: The aortic valve was not well visualized. Aortic valve regurgitation is not visualized. Mild aortic valve sclerosis is present, with no evidence of aortic valve stenosis.  Pulmonic Valve: The pulmonic valve was normal in structure. Pulmonic valve regurgitation is not visualized.  Aorta: The aortic root is normal in size and structure.  Venous: The inferior vena cava is normal in size with greater than 50% respiratory variability, suggesting right atrial pressure of 3 mmHg.  IAS/Shunts: No atrial level shunt detected by color flow Doppler.   LEFT VENTRICLE PLAX 2D LVIDd:         4.60 cm  Diastology LVIDs:         1.90 cm  LV e' medial:    6.78 cm/s LV PW:         1.00 cm  LV E/e' medial:  13.8 LV IVS:        1.50 cm  LV e' lateral:   7.29 cm/s LVOT diam:     2.05 cm  LV E/e' lateral: 12.9 LV SV:         61 LV SV Index:   32 LVOT Area:     3.30 cm   RIGHT VENTRICLE RV Basal diam:  2.00 cm RV S prime:     10.80 cm/s  LEFT ATRIUM             Index       RIGHT ATRIUM           Index LA diam:        3.20 cm 1.64 cm/m  RA Area:     11.50 cm LA Vol (A2C):   45.8 ml 23.53 ml/m RA Volume:   23.20 ml  11.92 ml/m LA Vol (A4C):   22.0 ml 11.30 ml/m LA Biplane Vol: 33.1 ml 17.01 ml/m AORTIC VALVE LVOT Vmax:   84.90 cm/s LVOT Vmean:  58.400 cm/s LVOT VTI:    0.186 m  AORTA Ao Root diam: 2.60 cm  MITRAL VALVE MV Area (PHT): 4.64 cm    SHUNTS MV Decel Time: 164 msec    Systemic VTI:  0.19 m MV E velocity: 93.67 cm/s  Systemic Diam: 2.05 cm MV A velocity: 89.73 cm/s MV E/A ratio:  1.04  Loralie Champagne MD Electronically signed by Loralie Champagne MD Signature Date/Time: 11/14/2020/5:23:15 PM    Final              History of  Present Illness:   Katherine Rush is a 76 y.o. female with the above problem list.  She was last seen by Dr. Acie Fredrickson 06/08/22.  She returns for f/u. She is here alone. She continues to have dyspnea on exertion. She describes NYHA IIb-III symptoms.  She has not had chest pain. She had a low risk Myoview last year. She sleeps on an incline chronically. She has not had syncope. Leg edema is stable.   EKG:  not done    Reviewed and updated this encounter:   Tobacco  Allergies  Meds  Problems  Med Hx  Surg Hx  Fam Hx      ROS   Labs/Other Test Reviewed:    Recent Labs: 12/20/2021: BUN 37; Creatinine, Ser 1.95; Hemoglobin 13.5; Platelets 204.0; Potassium 4.8; Sodium 140 05/24/2022: ALT 47   Recent Lipid Panel No results for input(s): "CHOL", "TRIG", "HDL", "VLDL", "LDLCALC", "LDLDIRECT" in the last 8760 hours.    Risk Assessment/Calculations/Metrics:              Physical Exam:    VS:  BP 120/70 (BP Location: Left Arm, Patient Position: Sitting, Cuff Size: Normal)   Pulse 80   Ht 5' 3.5" (1.613 m)   Wt 215 lb (97.5 kg)   BMI 37.49 kg/m     Wt Readings from Last 3 Encounters:  11/14/22 215 lb (97.5 kg)  06/08/22 215 lb 12.8 oz (97.9 kg)  05/24/22 214 lb 8 oz (97.3 kg)    Constitutional:      Appearance: Healthy appearance. Not in distress.  Neck:     Vascular: JVD normal.  Pulmonary:     Effort: Pulmonary effort is normal.     Breath sounds: No wheezing. No rales.  Cardiovascular:     Normal rate. Regular rhythm. Normal S1. Normal S2.      Murmurs: There is a grade 1/6 systolic murmur at the URSB.  Edema:    Peripheral edema absent.  Abdominal:     Palpations: Abdomen is soft.  Skin:    General: Skin is warm and dry.  Neurological:     Mental Status: Alert and oriented to person, place and time.         ASSESSMENT & PLAN:   (HFpEF) heart failure with preserved ejection fraction ([place]) She remains symptomatic. She has chronic kidney disease with GFR ~ 25. We  have had to cut back on Lasix due to worsening renal function. We discussed the rationale for GDMT with (HFpEF) heart failure with preserved ejection fraction patients including SGLT2 inhibitors, Entresto, spironolactone.  I do not think she is a good candidate for spironolactone.  Her blood pressure is controlled.  Therefore, she does not need Entresto.  I do think she would be a great candidate for SGLT2 inhibitor.  We discussed the potential for GU side effects.  I will obtain a follow-up BMET today.  As long as her GFR remains above 20, I will start her on Jardiance 10 mg daily with repeat BMET in 2 weeks.  Continue Lasix 20 mg daily.  Follow-up in 6 months.  Hyperlipidemia L in August 2023 was 93.  She has a history of aortic atherosclerosis on prior CT scan.  She also has a history of hepatic steatosis.  We discussed the benefits of statin therapy.  Start pravastatin 20 mg daily.  Obtain LFTs in 4 weeks.  Arrange CMET, lipids in 3 months.  Aortic atherosclerosis ([place]) Noted on prior CT.  Recommendation is for aspirin and statin therapy.  She does have a history of peptic ulcer disease and continues to have some stomach issues.  Therefore, I do not think she is a good candidate for aspirin therapy.  However, I will start her on pravastatin 20 mg daily as outlined above.  Essential hypertension The patient's blood pressure is controlled on her current regimen.  Continue current therapy with carvedilol 6.25 mg twice daily, Benicar 40 mg daily.  Stage 3a chronic kidney disease ([place]) Obtain BMET today.            Dispo:  Return in about 6 months (around 05/16/2023) for Routine Follow Up, w/ Dr. Acie Fredrickson, or Richardson Dopp, PA-C.   Medication Adjustments/Labs and Tests Ordered: Current medicines are reviewed at length with the patient today.  Concerns regarding medicines are outlined above.  Tests Ordered: Orders Placed This Encounter  Procedures   Basic metabolic panel   Hepatic function panel    Comprehensive metabolic panel   Lipid panel   Medication Changes: Meds ordered this encounter  Medications   pravastatin (PRAVACHOL) 20 MG tablet    Sig: Take 1 tablet (20 mg total) by mouth at bedtime.    Dispense:  90 tablet    Refill:  3   Signed, Richardson Dopp, PA-C  11/14/2022 12:23 PM    [address], [place], [place]  [address] Phone: [phone]; Fax: [phone]

## 2022-11-14 ENCOUNTER — Ambulatory Visit: Payer: PPO | Attending: Physician Assistant | Admitting: Physician Assistant

## 2022-11-14 ENCOUNTER — Encounter: Payer: Self-pay | Admitting: Physician Assistant

## 2022-11-14 VITALS — BP 120/70 | HR 80 | Ht 63.5 in | Wt 215.0 lb

## 2022-11-14 DIAGNOSIS — I10 Essential (primary) hypertension: Secondary | ICD-10-CM | POA: Diagnosis not present

## 2022-11-14 DIAGNOSIS — N1831 Chronic kidney disease, stage 3a: Secondary | ICD-10-CM

## 2022-11-14 DIAGNOSIS — I5032 Chronic diastolic (congestive) heart failure: Secondary | ICD-10-CM

## 2022-11-14 DIAGNOSIS — I700 Atherosclerosis of aorta: Secondary | ICD-10-CM

## 2022-11-14 DIAGNOSIS — E7849 Other hyperlipidemia: Secondary | ICD-10-CM

## 2022-11-14 MED ORDER — PRAVASTATIN SODIUM 20 MG PO TABS: 20.0000 mg | ORAL_TABLET | Freq: Every day | ORAL | 3 refills | Status: DC

## 2022-11-14 NOTE — Assessment & Plan Note (Signed)
Noted on prior CT.  Recommendation is for aspirin and statin therapy.  She does have a history of peptic ulcer disease and continues to have some stomach issues.  Therefore, I do not think she is a good candidate for aspirin therapy.  However, I will start her on pravastatin 20 mg daily as outlined above.

## 2022-11-14 NOTE — Assessment & Plan Note (Signed)
L in August 2023 was 93.  She has a history of aortic atherosclerosis on prior CT scan.  She also has a history of hepatic steatosis.  We discussed the benefits of statin therapy.  Start pravastatin 20 mg daily.  Obtain LFTs in 4 weeks.  Arrange CMET, lipids in 3 months.

## 2022-11-14 NOTE — Assessment & Plan Note (Signed)
She remains symptomatic. She has chronic kidney disease with GFR ~ 25. We have had to cut back on Lasix due to worsening renal function. We discussed the rationale for GDMT with (HFpEF) heart failure with preserved ejection fraction patients including SGLT2 inhibitors, Entresto, spironolactone.  I do not think she is a good candidate for spironolactone.  Her blood pressure is controlled.  Therefore, she does not need Entresto.  I do think she would be a great candidate for SGLT2 inhibitor.  We discussed the potential for GU side effects.  I will obtain a follow-up BMET today.  As long as her GFR remains above 20, I will start her on Jardiance 10 mg daily with repeat BMET in 2 weeks.  Continue Lasix 20 mg daily.  Follow-up in 6 months.

## 2022-11-14 NOTE — Patient Instructions (Signed)
Medication Instructions:  1.Start pravastatin 20 mg daily at bedtime. *If you need a refill on your cardiac medications before your next appointment, please call your pharmacy*   Lab Work: BMET today LFT'S in 4 weeks CMET and Lipids in 3 months If you have labs (blood work) drawn today and your tests are completely normal, you will receive your results only by: [place] (if you have MyChart) OR A paper copy in the mail If you have any lab test that is abnormal or we need to change your treatment, we will call you to review the results.   Follow-Up: At [facility], you and your health needs are our priority.  As part of our continuing mission to provide you with exceptional heart care, we have created designated Provider Care Teams.  These Care Teams include your primary Cardiologist (physician) and Advanced Practice Providers (APPs -  Physician Assistants and Nurse Practitioners) who all work together to provide you with the care you need, when you need it.  Your next appointment:   6 month(s)  The format for your next appointment:   In Person  Provider:   Mertie Moores, MD  or Richardson Dopp, PA-C       Important Information About Sugar

## 2022-11-14 NOTE — Assessment & Plan Note (Signed)
Obtain BMET today.

## 2022-11-14 NOTE — Assessment & Plan Note (Signed)
The patient's blood pressure is controlled on her current regimen.  Continue current therapy with carvedilol 6.25 mg twice daily, Benicar 40 mg daily.

## 2022-11-15 ENCOUNTER — Telehealth: Payer: Self-pay

## 2022-11-15 DIAGNOSIS — I5032 Chronic diastolic (congestive) heart failure: Secondary | ICD-10-CM

## 2022-11-15 LAB — BASIC METABOLIC PANEL
BUN/Creatinine Ratio: 20 (ref 12–28)
BUN: 37 mg/dL — ABNORMAL HIGH (ref 8–27)
CO2: 25 mmol/L (ref 20–29)
Calcium: 9.9 mg/dL (ref 8.7–10.3)
Chloride: 103 mmol/L (ref 96–106)
Creatinine, Ser: 1.81 mg/dL — ABNORMAL HIGH (ref 0.57–1.00)
Glucose: 64 mg/dL — ABNORMAL LOW (ref 70–99)
Potassium: 4.7 mmol/L (ref 3.5–5.2)
Sodium: 142 mmol/L (ref 134–144)
eGFR: 29 mL/min/{1.73_m2} — ABNORMAL LOW (ref >59)

## 2022-11-15 MED ORDER — EMPAGLIFLOZIN 10 MG PO TABS: 10.0000 mg | ORAL_TABLET | Freq: Every day | ORAL | 3 refills | Status: DC

## 2022-11-15 NOTE — Telephone Encounter (Signed)
-----   Message from Liliane Shi, PA-C sent at 11/15/2022  7:53 AM EST ----- Creatinine stable. K+ normal.  PLAN:  -Start Jardiance 10 mg once daily. -Please give 2 weeks of samples and any Rx card we have. -BMET 2 weeks.  -F/u with me in 2-3 mos  Richardson Dopp, PA-C    11/15/2022 7:50 AM

## 2022-11-15 NOTE — Telephone Encounter (Signed)
The patient has been notified of the result and verbalized understanding.  All questions (if any) were answered. Bernestine Amass, RN 11/15/2022 11:30 AM   Let samples of Jardiance for patient to pick up. Prescription has been sent in. Repeat labs have been scheduled. Follow up appointment scheduled.

## 2022-11-28 DIAGNOSIS — M25562 Pain in left knee: Secondary | ICD-10-CM | POA: Diagnosis not present

## 2022-11-28 DIAGNOSIS — M25511 Pain in right shoulder: Secondary | ICD-10-CM | POA: Diagnosis not present

## 2022-11-28 DIAGNOSIS — N1832 Chronic kidney disease, stage 3b: Secondary | ICD-10-CM | POA: Diagnosis not present

## 2022-11-28 DIAGNOSIS — Z23 Encounter for immunization: Secondary | ICD-10-CM | POA: Diagnosis not present

## 2022-11-28 DIAGNOSIS — M79605 Pain in left leg: Secondary | ICD-10-CM | POA: Diagnosis not present

## 2022-11-28 DIAGNOSIS — I129 Hypertensive chronic kidney disease with stage 1 through stage 4 chronic kidney disease, or unspecified chronic kidney disease: Secondary | ICD-10-CM | POA: Diagnosis not present

## 2022-12-12 ENCOUNTER — Ambulatory Visit: Payer: PPO | Attending: Physician Assistant

## 2022-12-12 DIAGNOSIS — N1831 Chronic kidney disease, stage 3a: Secondary | ICD-10-CM

## 2022-12-12 DIAGNOSIS — I5032 Chronic diastolic (congestive) heart failure: Secondary | ICD-10-CM

## 2022-12-12 LAB — HEPATIC FUNCTION PANEL
ALT: 24 IU/L (ref 0–32)
AST: 30 IU/L (ref 0–40)
Albumin: 4.2 g/dL (ref 3.8–4.8)
Alkaline Phosphatase: 93 IU/L (ref 44–121)
Bilirubin Total: 0.4 mg/dL (ref 0.0–1.2)
Bilirubin, Direct: 0.16 mg/dL (ref 0.00–0.40)
Total Protein: 6.7 g/dL (ref 6.0–8.5)

## 2022-12-12 LAB — BASIC METABOLIC PANEL
BUN/Creatinine Ratio: 25 (ref 12–28)
BUN: 49 mg/dL — ABNORMAL HIGH (ref 8–27)
CO2: 24 mmol/L (ref 20–29)
Calcium: 9.1 mg/dL (ref 8.7–10.3)
Chloride: 102 mmol/L (ref 96–106)
Creatinine, Ser: 1.97 mg/dL — ABNORMAL HIGH (ref 0.57–1.00)
Glucose: 116 mg/dL — ABNORMAL HIGH (ref 70–99)
Potassium: 4.9 mmol/L (ref 3.5–5.2)
Sodium: 139 mmol/L (ref 134–144)
eGFR: 26 mL/min/{1.73_m2} — ABNORMAL LOW (ref >59)

## 2023-01-23 ENCOUNTER — Encounter: Payer: Self-pay | Admitting: Physician Assistant

## 2023-01-23 ENCOUNTER — Ambulatory Visit: Payer: PPO | Attending: Physician Assistant | Admitting: Physician Assistant

## 2023-01-23 VITALS — BP 118/60 | HR 85 | Ht 63.5 in | Wt 215.8 lb

## 2023-01-23 DIAGNOSIS — N1831 Chronic kidney disease, stage 3a: Secondary | ICD-10-CM | POA: Diagnosis not present

## 2023-01-23 DIAGNOSIS — I10 Essential (primary) hypertension: Secondary | ICD-10-CM | POA: Diagnosis not present

## 2023-01-23 DIAGNOSIS — I5032 Chronic diastolic (congestive) heart failure: Secondary | ICD-10-CM

## 2023-01-23 DIAGNOSIS — E7800 Pure hypercholesterolemia, unspecified: Secondary | ICD-10-CM

## 2023-01-23 NOTE — Assessment & Plan Note (Signed)
Renal function has remained stable.  Obtain repeat CMET tomorrow.

## 2023-01-23 NOTE — Assessment & Plan Note (Signed)
The patient's blood pressure is controlled on her current regimen.  Continue current therapy.   

## 2023-01-23 NOTE — Progress Notes (Signed)
Cardiology Office Note:    Date:  01/23/2023   ID:  Katherine Rush, DOB 1946/07/05, MRN [id number]  PCP:  Burnard Bunting, MD  [facility] Cardiologist:  Mertie Moores, MD Cardiology APP:  Sharmon Revere    Referring MD: Burnard Bunting, MD   Patient Profile: HFpEF) heart failure with preserved ejection fraction  Echocardiogram 11/17/20: EF 60-65, Gr 1 DD, mild LVH, normal RVSF, trivial MR, AV sclerosis without stenosis, RAP 3 Hypertension  Hyperlipidemia  Chronic kidney disease  Myoview 09/21/21: EF 86, no ischemia Chronic back pain due to spinal stenosis + DDD FHx of CAD  Morbid obesity  Chronic leg edema  GERD Hepatic steatosis (CT in 9/21) Aortic atherosclerosis     History of Present Illness:   Katherine Rush is a 77 y.o. female with the above problem list.  She was last seen 11/14/22. I placed her on Jardiance to advance Rx of her (HFpEF) heart failure with preserved ejection fraction. She returns for f/u on CHF.  She is here alone.  Since starting on Jardiance, she has felt better.  She notes less swelling in her legs and hands.  She feels that her breathing improved.  She has been concerned about hypoglycemia.  She has felt at times that her sugar is dropping.  She has been eating more frequent meals.  Her weight did go down at 1 point but then increased back up a couple of pounds.  Lately, she has not felt as good as she did initially.  She has not had chest pain, syncope.  She has had some dizziness.  She sleeps on an incline chronically.   Subjective    Reviewed and updated this encounter:  Tobacco  Allergies  Meds  Problems  Med Hx  Surg Hx  Fam Hx     ROS  Objective   Labs/Other Test Reviewed:   Recent Labs: 12/12/2022: ALT 24; BUN 49; Creatinine, Ser 1.97; Potassium 4.9; Sodium 139   Recent Lipid Panel No results for input(s): "CHOL", "TRIG", "HDL", "VLDL", "LDLCALC", "LDLDIRECT" in the last 8760 hours.   Risk  Assessment/Calculations/Metrics:             Physical Exam:   VS:  BP 118/60   Pulse 85   Ht 5' 3.5" (1.613 m)   Wt 215 lb 12.8 oz (97.9 kg)   SpO2 99%   BMI 37.63 kg/m    Wt Readings from Last 3 Encounters:  01/23/23 215 lb 12.8 oz (97.9 kg)  11/14/22 215 lb (97.5 kg)  06/08/22 215 lb 12.8 oz (97.9 kg)    Constitutional:      Appearance: Healthy appearance. Not in distress.  Neck:     Vascular: JVD normal.  Pulmonary:     Effort: Pulmonary effort is normal.     Breath sounds: No wheezing. No rales.  Cardiovascular:     Normal rate. Regular rhythm. Normal S1. Normal S2.      Murmurs: There is no murmur.  Edema:    Peripheral edema present.    Ankle: bilateral trace edema of the ankle. Abdominal:     Palpations: Abdomen is soft.     Assessment & Plan    ASSESSMENT & PLAN:   (HFpEF) heart failure with preserved ejection fraction ([facility]) Overall, she has had improved symptoms since starting on Jardiance.  She has had some symptoms of hypoglycemia and has increased frequency of her meals.  I have reassured her that Jardiance should not cause significant  hypoglycemia.  I suspect she has gained some weight in light of her change in diet which is contributing to some more shortness of breath.  I have asked her to try to resume a normal diet.  Continue furosemide 20 mg daily, Jardiance 10 mg daily.  If she continues to have issues with symptoms of hypoglycemia, we could try to decrease her dose of Jardiance to 5 mg.  She has fasting labs pending.  Arrange for these to be obtained tomorrow (CMET).  Hyperlipidemia Continue pravastatin 20 mg daily.  She does note some arthralgias.  She saw her orthopedist and had a cortisone injection in her knee.  Arrange CMET, lipids tomorrow.  If she continues to have arthralgias, we can hold her pravastatin for a few weeks to see if her symptoms improve.  Essential hypertension The patient's blood pressure is controlled on her current regimen.   Continue current therapy.   Stage 3a chronic kidney disease ([place]) Renal function has remained stable.  Obtain repeat CMET tomorrow.             Dispo:  Return in 4 months (on 05/28/2023) for Scheduled Follow Up, w/ Dr. Acie Fredrickson.   Signed, Richardson Dopp, PA-C  01/23/2023 2:01 PM    [address], [place], [place]  [address] Phone: [phone]; Fax: [phone]

## 2023-01-23 NOTE — Patient Instructions (Signed)
Medication Instructions:  Your physician recommends that you continue on your current medications as directed. Please refer to the Current Medication list given to you today.  *If you need a refill on your cardiac medications before your next appointment, please call your pharmacy*   Lab Work: COME TOMORROW FOR FASTING LABS, ANYTIME AFTER 7:15 IN THE MORNING, BEFORE 4:45  If you have labs (blood work) drawn today and your tests are completely normal, you will receive your results only by: MyChart Message (if you have MyChart) OR A paper copy in the mail If you have any lab test that is abnormal or we need to change your treatment, we will call you to review the results.   Testing/Procedures: None ordered   Follow-Up: At [facility], you and your health needs are our priority.  As part of our continuing mission to provide you with exceptional heart care, we have created designated Provider Care Teams.  These Care Teams include your primary Cardiologist (physician) and Advanced Practice Providers (APPs -  Physician Assistants and Nurse Practitioners) who all work together to provide you with the care you need, when you need it.  We recommend signing up for the patient portal called "MyChart".  Sign up information is provided on this After Visit Summary.  MyChart is used to connect with patients for Virtual Visits (Telemedicine).  Patients are able to view lab/test results, encounter notes, upcoming appointments, etc.  Non-urgent messages can be sent to your provider as well.   To learn more about what you can do with MyChart, go to [email or web address].    Your next appointment:   As scheduled   Provider:   Mertie Moores, MD     Other Instructions

## 2023-01-23 NOTE — Assessment & Plan Note (Signed)
Overall, she has had improved symptoms since starting on Jardiance.  She has had some symptoms of hypoglycemia and has increased frequency of her meals.  I have reassured her that Jardiance should not cause significant hypoglycemia.  I suspect she has gained some weight in light of her change in diet which is contributing to some more shortness of breath.  I have asked her to try to resume a normal diet.  Continue furosemide 20 mg daily, Jardiance 10 mg daily.  If she continues to have issues with symptoms of hypoglycemia, we could try to decrease her dose of Jardiance to 5 mg.  She has fasting labs pending.  Arrange for these to be obtained tomorrow (CMET).

## 2023-01-23 NOTE — Assessment & Plan Note (Signed)
Continue pravastatin 20 mg daily.  She does note some arthralgias.  She saw her orthopedist and had a cortisone injection in her knee.  Arrange CMET, lipids tomorrow.  If she continues to have arthralgias, we can hold her pravastatin for a few weeks to see if her symptoms improve.

## 2023-01-24 ENCOUNTER — Ambulatory Visit: Payer: PPO | Attending: Physician Assistant

## 2023-01-24 DIAGNOSIS — E7849 Other hyperlipidemia: Secondary | ICD-10-CM

## 2023-01-24 DIAGNOSIS — I5032 Chronic diastolic (congestive) heart failure: Secondary | ICD-10-CM

## 2023-01-24 DIAGNOSIS — N1831 Chronic kidney disease, stage 3a: Secondary | ICD-10-CM | POA: Diagnosis not present

## 2023-01-24 LAB — COMPREHENSIVE METABOLIC PANEL
ALT: 23 IU/L (ref 0–32)
AST: 29 IU/L (ref 0–40)
Albumin/Globulin Ratio: 1.8 (ref 1.2–2.2)
Albumin: 4.1 g/dL (ref 3.8–4.8)
Alkaline Phosphatase: 97 IU/L (ref 44–121)
BUN/Creatinine Ratio: 17 (ref 12–28)
BUN: 36 mg/dL — ABNORMAL HIGH (ref 8–27)
Bilirubin Total: 0.4 mg/dL (ref 0.0–1.2)
CO2: 25 mmol/L (ref 20–29)
Calcium: 9.2 mg/dL (ref 8.7–10.3)
Chloride: 101 mmol/L (ref 96–106)
Creatinine, Ser: 2.16 mg/dL — ABNORMAL HIGH (ref 0.57–1.00)
Globulin, Total: 2.3 g/dL (ref 1.5–4.5)
Glucose: 123 mg/dL — ABNORMAL HIGH (ref 70–99)
Potassium: 4.6 mmol/L (ref 3.5–5.2)
Sodium: 141 mmol/L (ref 134–144)
Total Protein: 6.4 g/dL (ref 6.0–8.5)
eGFR: 23 mL/min/{1.73_m2} — ABNORMAL LOW (ref >59)

## 2023-01-24 LAB — LIPID PANEL
Chol/HDL Ratio: 3.7 ratio (ref 0.0–4.4)
Cholesterol, Total: 123 mg/dL (ref 100–199)
HDL: 33 mg/dL — ABNORMAL LOW (ref >39)
LDL Chol Calc (NIH): 68 mg/dL (ref 0–99)
Triglycerides: 124 mg/dL (ref 0–149)
VLDL Cholesterol Cal: 22 mg/dL (ref 5–40)

## 2023-01-25 ENCOUNTER — Telehealth: Payer: Self-pay | Admitting: *Deleted

## 2023-01-25 DIAGNOSIS — I10 Essential (primary) hypertension: Secondary | ICD-10-CM

## 2023-01-25 DIAGNOSIS — N1831 Chronic kidney disease, stage 3a: Secondary | ICD-10-CM

## 2023-01-25 MED ORDER — FUROSEMIDE 20 MG PO TABS: 20.0000 mg | ORAL_TABLET | ORAL | 3 refills | Status: DC

## 2023-01-25 NOTE — Telephone Encounter (Signed)
-----   Message from Liliane Shi, [place] sent at 01/25/2023  8:01 AM EST ----- Results sent to Claude Manges via [place]. See MyChart comment. PLAN: -Hold Lasix x 3 days -Then, resume Lasix 20 mg every Monday, Wednesday, Friday -BMET 1 week -If patient agreeable, refer to nephrology for chronic kidney disease  Ms. Dierks  Your creatinine (kidney function) has increased somewhat.  Your potassium, AST/ALT (liver enzymes) are normal.  Your cholesterol is optimal.  I would like for you to not take furosemide (Lasix) for 3 days.  Then resume taking furosemide 20 mg 3 days a week (Monday, Wednesday, Friday).  I will recheck labs again in 1 week.  It may be good for [place] to refer you to a nephrologist to continue to monitor your kidney function. Richardson Dopp, PA-C    01/25/2023 7:57 AM

## 2023-01-25 NOTE — Addendum Note (Signed)
Addended by: Gaetano Net on: 01/25/2023 09:49 AM   Modules accepted: Orders

## 2023-01-29 ENCOUNTER — Ambulatory Visit: Payer: PPO | Admitting: Physician Assistant

## 2023-01-31 ENCOUNTER — Other Ambulatory Visit: Payer: Self-pay | Admitting: Internal Medicine

## 2023-02-01 ENCOUNTER — Ambulatory Visit: Payer: PPO | Attending: Physician Assistant

## 2023-02-01 DIAGNOSIS — I10 Essential (primary) hypertension: Secondary | ICD-10-CM | POA: Diagnosis not present

## 2023-02-01 LAB — BASIC METABOLIC PANEL
BUN/Creatinine Ratio: 17 (ref 12–28)
BUN: 31 mg/dL — ABNORMAL HIGH (ref 8–27)
CO2: 23 mmol/L (ref 20–29)
Calcium: 9.3 mg/dL (ref 8.7–10.3)
Chloride: 103 mmol/L (ref 96–106)
Creatinine, Ser: 1.82 mg/dL — ABNORMAL HIGH (ref 0.57–1.00)
Glucose: 69 mg/dL — ABNORMAL LOW (ref 70–99)
Potassium: 4.3 mmol/L (ref 3.5–5.2)
Sodium: 141 mmol/L (ref 134–144)
eGFR: 28 mL/min/{1.73_m2} — ABNORMAL LOW (ref >59)

## 2023-02-13 ENCOUNTER — Other Ambulatory Visit: Payer: PPO

## 2023-02-28 ENCOUNTER — Other Ambulatory Visit: Payer: Self-pay | Admitting: Internal Medicine

## 2023-03-21 DIAGNOSIS — I5030 Unspecified diastolic (congestive) heart failure: Secondary | ICD-10-CM | POA: Diagnosis not present

## 2023-03-21 DIAGNOSIS — K760 Fatty (change of) liver, not elsewhere classified: Secondary | ICD-10-CM | POA: Diagnosis not present

## 2023-03-21 DIAGNOSIS — I129 Hypertensive chronic kidney disease with stage 1 through stage 4 chronic kidney disease, or unspecified chronic kidney disease: Secondary | ICD-10-CM | POA: Diagnosis not present

## 2023-03-21 DIAGNOSIS — N184 Chronic kidney disease, stage 4 (severe): Secondary | ICD-10-CM | POA: Diagnosis not present

## 2023-04-04 DIAGNOSIS — N184 Chronic kidney disease, stage 4 (severe): Secondary | ICD-10-CM | POA: Diagnosis not present

## 2023-05-09 ENCOUNTER — Other Ambulatory Visit: Payer: Self-pay | Admitting: Cardiovascular Disease

## 2023-05-27 NOTE — Progress Notes (Signed)
Cardiology Office Note:    Date:  05/28/2023   ID:  Katherine Rush, DOB 1946/03/20, MRN [id number]  PCP:  Geoffry Paradise, MD  [facility] Cardiologist:  Jadden Yim [facility] Electrophysiologist:  None   Referring MD: Geoffry Paradise, MD   Chief Complaint  Patient presents with   Hypertension   Congestive Heart Failure         Nov. 23, 2021   Katherine Rush is a 77 y.o. female with a hx of HTN, HLD. We were asked to see her today by Dr. Jacky Kindle for further evaluation and management of her HTN. She was recently seen by the NP at [facility]   Has been having some chest discomfort recently  Pressure,  Usually during the night when she is lying down  Sleeps in a recliner due to back issues + family hx of cardiac disease   Does not have CP with exertion, but does have DOE .    Eating some salt in her diet ,  Does not watch her diet since her mother passed away   Does not get any regular exercise ,  Has been caring for her mother who recently passed away after 4 years of full time care .   February 13, 2021;  Katherine Rush is a 77 yo with hx of HTN, morbid obesity, HLD . Chronic leg edema . Echo from Dec. 2021 shows normal LV systolic function witih grade  1 DD BP is better  Is on a diet .  But is gaining weight   She had her labs repeat at [facility] medical  - she did not get a call   She cannot access the patient portal so she does not know those results   June 08, 2022: Katherine Rush is seen today for follow-up of her hypertension, chronic diastolic congestive heart failure, morbid obesity, hyperlipidemia.  She has chronic leg edema. Wt today is 215 lbs . ( Down 4 lbs from last year )   She was seen by Tereso Newcomer in October and November 2022.  She was having some symptoms of volume excess.  He stopped her chlorthalidone and started Lasix.  This resulted in an increase in her creatinine.  The Lasix dose was further decreased.  Stress Myoview study was normal.   There was no evidence of ischemia and normal LV systolic function.  Creatinine is around 1.95.  She still has DOE  Still eating salt on occasion  Has H . Pylori and COVID several months ago  Breakfast - waffles wit blueberries or cereal Lunch - sandwich, or meat , veggie  Dinner - yogart, granola   May 28, 2023 Katherine Rush is seen for follow up of her HLD, obesity, HTN  I doing well Sees a nephrologist ( Dr. Signe Colt)  She changed the benicar to 20 mg BID  Still has leg edema  Dr. Signe Colt has restarted lasix   Her last echo was Dec. 2021 Normal LV systolic function,  has grade I DD   Still very short of breath with all activity ( Showing , dressing  getting the mail ( down a steep driveway )   She does not exercise     I suggested a pulmonary evaluation  Suggested cycling for exercise     Past Medical History:  Diagnosis Date   Allergic rhinitis    Gastroesophageal reflux disease    Gout    Helicobacter pylori gastritis    History of colon polyps    Hyperlipidemia  Hypertension    IBS (irritable bowel syndrome)    Internal hemorrhoids    Lexiscan Myoview 09/2021    Myoview 10/22: no ischemia, EF 86, low risk   Non-alcoholic fatty liver disease    Spinal stenosis of lumbar region    Tubular adenoma of colon     Past Surgical History:  Procedure Laterality Date   APPENDECTOMY  1960   BREAST BIOPSY  1985   right   CATARACT EXTRACTION  07/2011   TOTAL ABDOMINAL HYSTERECTOMY W/ BILATERAL SALPINGOOPHORECTOMY  1990    Current Medications: Current Meds  Medication Sig   acetaminophen (TYLENOL) 500 MG tablet Take 500 mg by mouth every 6 (six) hours as needed.   allopurinol (ZYLOPRIM) 100 MG tablet Take 100 mg by mouth daily.   carvedilol (COREG) 6.25 MG tablet TAKE 1 TABLET BY MOUTH TWICE DAILY   colchicine 0.6 MG tablet 0.3 mg.   Cyanocobalamin (VITAMIN B 12) 500 MCG TABS Take 1,000 mcg by mouth daily.   empagliflozin (JARDIANCE) 10 MG TABS tablet Take 1  tablet (10 mg total) by mouth daily before breakfast.   furosemide (LASIX) 20 MG tablet Take 1 tablet (20 mg total) by mouth 3 (three) times a week. (Patient taking differently: Take 40 mg by mouth 3 (three) times a week. Patient taking 1/2 tablet daily)   olmesartan (BENICAR) 40 MG tablet Take 40 mg by mouth daily. Taking 20 mg in the a.m. and 20 mg at night   omeprazole (PRILOSEC) 40 MG capsule TAKE 1 CAPSULE BY MOUTH DAILY   ondansetron (ZOFRAN) 4 MG tablet Take 4 mg by mouth every 8 (eight) hours as needed for nausea or vomiting.   pravastatin (PRAVACHOL) 20 MG tablet Take 1 tablet (20 mg total) by mouth at bedtime.   promethazine (PHENERGAN) 25 MG tablet Take 0.5 tablets (12.5 mg total) by mouth every 6 (six) hours as needed for nausea or vomiting.   Vitamin E 400 units TABS Take 2 tablets by mouth in the morning.     Allergies:   Fish allergy, Shellfish-derived products, Iodine, Ivp dye [iodinated contrast media], Other, Penicillins, and Sulfonamide derivatives   Social History   Socioeconomic History   Marital status: Single    Spouse name: Not on file   Number of children: Not on file   Years of education: Not on file   Highest education level: Not on file  Occupational History   Not on file  Tobacco Use   Smoking status: Never   Smokeless tobacco: Never  Vaping Use   Vaping Use: Never used  Substance and Sexual Activity   Alcohol use: No    Alcohol/week: 0.0 standard drinks of alcohol   Drug use: No   Sexual activity: Not on file  Other Topics Concern   Not on file  Social History Narrative   Not on file   Social Determinants of Health   Financial Resource Strain: Not on file  Food Insecurity: Not on file  Transportation Needs: Not on file  Physical Activity: Not on file  Stress: Not on file  Social Connections: Not on file     Family History: The patient's family history includes Breast cancer in her cousin and maternal aunt; Cancer in her maternal aunt;  Diabetes in her mother; Heart attack in her father, maternal grandfather, and mother; Other in an other family member; Stroke in her maternal grandmother and mother; Throat cancer in her cousin. There is no history of Colon cancer.  ROS:  Please see the history of present illness.     All other systems reviewed and are negative.  EKGs/Labs/Other Studies Reviewed:    The following studies were reviewed today:     Recent Labs: 01/24/2023: ALT 23 02/01/2023: BUN 31; Creatinine, Ser 1.82; Potassium 4.3; Sodium 141  Recent Lipid Panel    Component Value Date/Time   CHOL 123 01/24/2023 0910   TRIG 124 01/24/2023 0910   HDL 33 (L) 01/24/2023 0910   CHOLHDL 3.7 01/24/2023 0910   LDLCALC 68 01/24/2023 0910     Risk Assessment/Calculations:       Physical Exam:    Physical Exam: Blood pressure 118/84, height 5' 3.5" (1.613 m), weight 217 lb (98.4 kg), SpO2 96 %.       GEN: moderately obese elderly female,  in no acute distress HEENT: Normal NECK: No JVD; No carotid bruits LYMPHATICS: No lymphadenopathy CARDIAC: RRR , no murmurs, rubs, gallops RESPIRATORY:  Clear to auscultation without rales, wheezing or rhonchi  ABDOMEN: Soft, non-tender, non-distended MUSCULOSKELETAL:  No edema; No deformity  SKIN: Warm and dry NEUROLOGIC:  Alert and oriented x 3\  EKG Interpretation  Date/Time:  Tuesday May 28 2023 11:14:50 EDT Ventricular Rate:  77 PR Interval:  148 QRS Duration: 74 QT Interval:  374 QTC Calculation: 423 R Axis:   61 Text Interpretation: Normal sinus rhythm Normal ECG No previous ECGs available Confirmed by Kristeen Miss ([id number]) on 05/28/2023 11:26:04 AM       ASSESSMENT:    1. Aortic atherosclerosis (HCC)   2. Chronic diastolic heart failure (HCC)   3. Leg edema     PLAN:    Chronic diastolic congestive heart failure:   has grade I DD.  Her symptoms seem to be out of proportion to her dyspnea  She does not exercise at all - limited by arthritis ,  knee issue Suggested stationary bike   She may benefit from a pulmonary referral    Hypertension:  BP is well controlled.    2.  Leg cramps:   3.  Obesity:    advised weight loss    4.  Leg edema: has mild lower ext edema  Advised leg elevation  Discuss referral to [facility]   .   Shared Decision Making/Informed Consent        Medication Adjustments/Labs and Tests Ordered: Current medicines are reviewed at length with the patient today.  Concerns regarding medicines are outlined above.  Orders Placed This Encounter  Procedures   EKG 12-Lead   No orders of the defined types were placed in this encounter.   Patient Instructions  Medication Instructions:  Your physician recommends that you continue on your current medications as directed. Please refer to the Current Medication list given to you today.  *If you need a refill on your cardiac medications before your next appointment, please call your pharmacy*     Follow-Up: At [facility], you and your health needs are our priority.  As part of our continuing mission to provide you with exceptional heart care, we have created designated Provider Care Teams.  These Care Teams include your primary Cardiologist (physician) and Advanced Practice Providers (APPs -  Physician Assistants and Nurse Practitioners) who all work together to provide you with the care you need, when you need it.    Your next appointment:   1 year(s)  Provider:   Kristeen Miss, MD     Signed, Kristeen Miss, MD  05/28/2023 11:43 AM    [facility]

## 2023-05-28 ENCOUNTER — Ambulatory Visit: Payer: PPO | Attending: Cardiovascular Disease | Admitting: Cardiovascular Disease

## 2023-05-28 ENCOUNTER — Encounter: Payer: Self-pay | Admitting: Cardiovascular Disease

## 2023-05-28 VITALS — BP 118/84 | Ht 63.5 in | Wt 217.0 lb

## 2023-05-28 DIAGNOSIS — I5032 Chronic diastolic (congestive) heart failure: Secondary | ICD-10-CM | POA: Diagnosis not present

## 2023-05-28 DIAGNOSIS — R600 Localized edema: Secondary | ICD-10-CM | POA: Diagnosis not present

## 2023-05-28 DIAGNOSIS — I700 Atherosclerosis of aorta: Secondary | ICD-10-CM

## 2023-05-28 NOTE — Patient Instructions (Signed)
Medication Instructions:  Your physician recommends that you continue on your current medications as directed. Please refer to the Current Medication list given to you today.  *If you need a refill on your cardiac medications before your next appointment, please call your pharmacy*     Follow-Up: At [facility], you and your health needs are our priority.  As part of our continuing mission to provide you with exceptional heart care, we have created designated Provider Care Teams.  These Care Teams include your primary Cardiologist (physician) and Advanced Practice Providers (APPs -  Physician Assistants and Nurse Practitioners) who all work together to provide you with the care you need, when you need it.   Your next appointment:   1 year(s)  Provider:   Philip Nahser, MD     

## 2023-06-19 DIAGNOSIS — N184 Chronic kidney disease, stage 4 (severe): Secondary | ICD-10-CM | POA: Diagnosis not present

## 2023-06-19 DIAGNOSIS — I5030 Unspecified diastolic (congestive) heart failure: Secondary | ICD-10-CM | POA: Diagnosis not present

## 2023-06-19 DIAGNOSIS — R0602 Shortness of breath: Secondary | ICD-10-CM | POA: Diagnosis not present

## 2023-06-19 DIAGNOSIS — K760 Fatty (change of) liver, not elsewhere classified: Secondary | ICD-10-CM | POA: Diagnosis not present

## 2023-06-19 DIAGNOSIS — I129 Hypertensive chronic kidney disease with stage 1 through stage 4 chronic kidney disease, or unspecified chronic kidney disease: Secondary | ICD-10-CM | POA: Diagnosis not present

## 2023-06-19 DIAGNOSIS — E785 Hyperlipidemia, unspecified: Secondary | ICD-10-CM | POA: Diagnosis not present

## 2023-06-26 ENCOUNTER — Ambulatory Visit: Payer: PPO | Admitting: Internal Medicine

## 2023-06-26 ENCOUNTER — Other Ambulatory Visit (INDEPENDENT_AMBULATORY_CARE_PROVIDER_SITE_OTHER): Payer: PPO

## 2023-06-26 ENCOUNTER — Encounter: Payer: Self-pay | Admitting: Internal Medicine

## 2023-06-26 VITALS — BP 118/60 | HR 84 | Ht 63.5 in | Wt 219.1 lb

## 2023-06-26 DIAGNOSIS — K760 Fatty (change of) liver, not elsewhere classified: Secondary | ICD-10-CM

## 2023-06-26 DIAGNOSIS — R110 Nausea: Secondary | ICD-10-CM

## 2023-06-26 DIAGNOSIS — K5900 Constipation, unspecified: Secondary | ICD-10-CM | POA: Diagnosis not present

## 2023-06-26 DIAGNOSIS — K219 Gastro-esophageal reflux disease without esophagitis: Secondary | ICD-10-CM | POA: Diagnosis not present

## 2023-06-26 DIAGNOSIS — E538 Deficiency of other specified B group vitamins: Secondary | ICD-10-CM | POA: Diagnosis not present

## 2023-06-26 DIAGNOSIS — R1013 Epigastric pain: Secondary | ICD-10-CM | POA: Diagnosis not present

## 2023-06-26 LAB — PROTIME-INR
INR: 1.2 ratio — ABNORMAL HIGH (ref 0.8–1.0)
Prothrombin Time: 12.5 s (ref 9.6–13.1)

## 2023-06-26 MED ORDER — PROMETHAZINE HCL 25 MG PO TABS: 12.5000 mg | ORAL_TABLET | Freq: Four times a day (QID) | ORAL | 1 refills | Status: AC | PRN

## 2023-06-26 MED ORDER — FAMOTIDINE 20 MG PO TABS: 20.0000 mg | ORAL_TABLET | Freq: Every evening | ORAL | Status: AC | PRN

## 2023-06-26 MED ORDER — METAMUCIL FIBER PO CHEW: CHEWABLE_TABLET | ORAL | Status: AC

## 2023-06-26 MED ORDER — OMEPRAZOLE 40 MG PO CPDR: 40.0000 mg | DELAYED_RELEASE_CAPSULE | Freq: Every day | ORAL | 3 refills | Status: DC

## 2023-06-26 MED ORDER — ONDANSETRON HCL 4 MG PO TABS: 4.0000 mg | ORAL_TABLET | Freq: Three times a day (TID) | ORAL | 1 refills | Status: AC | PRN

## 2023-06-26 NOTE — Patient Instructions (Addendum)
We have sent the following medications to your pharmacy for you to pick up at your convenience: Omeprazole Zofran Phenergan  Please purchase the following medications over the counter and take as directed: Pepcid 20 mg: take daily at bedtime as needed  Please go to the lab in the basement of our building to have lab work done as you leave today. Hit "B" for basement when you get on the elevator.  When the doors open the lab is on your left.  We will call you with the results. Thank you.  Resume Metamucil gummies Continue vitamin E 800 IUs daily  You will be contacted by [facility] in the next 2 days to arrange a ultrasound with elastography.  The number on your caller ID will be [phone], please answer when they call.  If you have not heard from them in 2 days please call [phone] to schedule:   You have been scheduled for an abdominal ultrasound at [facility] (1st floor of hospital) on __________________ at ______________________. Please arrive 30 minutes prior to your appointment for registration. Make certain not to have anything to eat or drink 6 hours prior to your appointment. Should you need to reschedule your appointment, please contact radiology at [phone]. This test typically takes about 30 minutes to perform.   Thank you for entrusting me with your care and for choosing [facility], Dr. Erick Blinks   If your blood pressure at your visit was 140/90 or greater, please contact your primary care physician to follow up on this. ______________________________________________________  If you are age 77 or older, your body mass index should be between 23-30. Your Body mass index is 38.21 kg/m. If this is out of the aforementioned range listed, please consider follow up with your Primary Care Provider.  If you are age 77 or younger, your body mass index should be between 19-25. Your Body mass index is 38.21 kg/m. If this is out of the  aformentioned range listed, please consider follow up with your Primary Care Provider.  ________________________________________________________  The  GI providers would like to encourage you to use [facility] to communicate with providers for non-urgent requests or questions.  Due to long hold times on the telephone, sending your provider a message by [facility] may be a faster and more efficient way to get a response.  Please allow 48 business hours for a response.  Please remember that this is for non-urgent requests.  _______________________________________________________  Due to recent changes in healthcare laws, you may see the results of your imaging and laboratory studies on MyChart before your provider has had a chance to review them.  We understand that in some cases there may be results that are confusing or concerning to you. Not all laboratory results come back in the same time frame and the provider may be waiting for multiple results in order to interpret others.  Please give [place] 48 hours in order for your provider to thoroughly review all the results before contacting the office for clarification of your results.

## 2023-06-26 NOTE — Progress Notes (Signed)
Subjective:    Patient ID: Katherine Rush, female    DOB: 1946/10/08, 77 y.o.   MRN: [id number]  HPI Katherine Rush is a 77 year old female with a history of GERD, prior gastritis and shallow gastric ulcer with H. pylori treated with Pylera (eradication confirmed by stool test), SIBO after positive breath test, MASLD, history of colon polyps who is here for follow-up.  She is here alone today and I last saw her just over 1 year ago.  She reports that she is feeling fairly well.  She has been using omeprazole 40 mg in the morning.  Occasionally she will have heartburn or indigestion in the evening and has thought about using a second dose but she has not.  She has rare nausea and will occasionally use either ondansetron or promethazine.  She occasionally will have burning epigastric discomfort in the evening.  She uses this infrequently and her old prescriptions have expired.  She has continued with her B12 supplementation as well as Vitamin E.  She was having issues with lower extremity edema and her Lasix dose has been titrated by both cardiology and nephrology.  Since starting Lasix back her lower extremity edema has improved.  She became less regular with her Metamucil Gummies and so her constipation has recurred.  No blood in stool or melena.  She has been referred to pulmonology for evaluation of ILD.  Her mother had ILD.  She does have DOE.   Review of Systems As per HPI, otherwise negative  Current Medications, Allergies, Past Medical History, Past Surgical History, Family History and Social History were reviewed in [employer] record.    Objective:   Physical Exam BP 118/60   Pulse 84   Ht 5' 3.5" (1.613 m)   Wt 219 lb 2 oz (99.4 kg)   BMI 38.21 kg/m  Gen: awake, alert, NAD HEENT: anicteric  CV: RRR, no mrg Pulm: CTA b/l Abd: soft, obese, NT/ND, +BS throughout Ext: no c/c, 1+ LE edema to the midshin Neuro: nonfocal  CMP     Component Value  Date/Time   NA 141 02/01/2023 1145   K 4.3 02/01/2023 1145   CL 103 02/01/2023 1145   CO2 23 02/01/2023 1145   GLUCOSE 69 (L) 02/01/2023 1145   GLUCOSE 86 12/20/2021 1059   BUN 31 (H) 02/01/2023 1145   CREATININE 1.82 (H) 02/01/2023 1145   CALCIUM 9.3 02/01/2023 1145   PROT 6.4 01/24/2023 0910   ALBUMIN 4.1 01/24/2023 0910   AST 29 01/24/2023 0910   ALT 23 01/24/2023 0910   ALKPHOS 97 01/24/2023 0910   BILITOT 0.4 01/24/2023 0910   GFR 24.78 (L) 12/20/2021 1059   EGFR 28 (L) 02/01/2023 1145   GFRNONAA 31 (L) 11/01/2020 1538         Assessment & Plan:  77 year old female with a history of GERD, prior gastritis and shallow gastric ulcer with H. pylori treated with Pylera (eradication confirmed by stool test), SIBO after positive breath test, MASLD, history of colon polyps who is here for follow-up.  GERD and dyspepsia/prior H. pylori associated gastric ulcer status posttreatment --she is having some dyspeptic symptoms later in the evening.  She is taking omeprazole 40 mg once daily -- Continue omeprazole 40 mg in the morning -- Add famotidine 20 mg in the evening on an as-needed basis -- For occasional nausea we will refill ondansetron 4 mg every 6 hours as needed nausea, promethazine 12.5 to 25 mg every 6 hours as needed  refractory nausea, #20 for both prescriptions with 1 refill  2.  Mild constipation --she has gotten off of the Metamucil Gummies which were very effective for her. -- Resume Metamucil Gummies daily   3. MASLD --she has been on vitamin E 800 IU daily.  She has not been able to have significant weight reduction though some of this is related to fluid retention related to CHF.  We discussed metabolic associated liver disease and the risk for fibrosis.  Recently her liver enzymes were normal which is very reassuring.  There is no evidence for advanced liver disease or cirrhosis clinically.  I recommended the following: -- Fib 4 score -- Ultrasound right upper quadrant  with elastography -- INR today -- Continue work on risk factor modification through healthy diet, exercise when possible under the direction of her cardiology team, control of blood pressure blood sugar and cholesterol  4.  B12 deficiency --improved on oral supplementation, continue this daily  Approximate 64-month follow-up, sooner if needed

## 2023-06-27 LAB — FIB-4
ALT: 23 IU/L (ref 0–32)
AST: 26 IU/L (ref 0–40)
FIB-4 Index: 2.45 (ref 0.00–2.67)
Platelets: 168 10*3/uL (ref 150–450)

## 2023-07-05 ENCOUNTER — Ambulatory Visit (HOSPITAL_COMMUNITY)
Admission: RE | Admit: 2023-07-05 | Discharge: 2023-07-05 | Disposition: A | Discharge status: Home / Self Care | Payer: PPO | Source: Ambulatory Visit | Attending: Internal Medicine | Admitting: Internal Medicine

## 2023-07-05 DIAGNOSIS — K760 Fatty (change of) liver, not elsewhere classified: Secondary | ICD-10-CM | POA: Insufficient documentation

## 2023-07-05 DIAGNOSIS — K709 Alcoholic liver disease, unspecified: Secondary | ICD-10-CM | POA: Diagnosis not present

## 2023-07-05 DIAGNOSIS — K5900 Constipation, unspecified: Secondary | ICD-10-CM | POA: Insufficient documentation

## 2023-07-05 DIAGNOSIS — E538 Deficiency of other specified B group vitamins: Secondary | ICD-10-CM | POA: Diagnosis not present

## 2023-07-05 DIAGNOSIS — R932 Abnormal findings on diagnostic imaging of liver and biliary tract: Secondary | ICD-10-CM | POA: Diagnosis not present

## 2023-07-05 DIAGNOSIS — Z944 Liver transplant status: Secondary | ICD-10-CM | POA: Diagnosis not present

## 2023-07-05 DIAGNOSIS — R1013 Epigastric pain: Secondary | ICD-10-CM | POA: Insufficient documentation

## 2023-07-05 DIAGNOSIS — K219 Gastro-esophageal reflux disease without esophagitis: Secondary | ICD-10-CM | POA: Insufficient documentation

## 2023-07-31 DIAGNOSIS — I129 Hypertensive chronic kidney disease with stage 1 through stage 4 chronic kidney disease, or unspecified chronic kidney disease: Secondary | ICD-10-CM | POA: Diagnosis not present

## 2023-07-31 DIAGNOSIS — R7989 Other specified abnormal findings of blood chemistry: Secondary | ICD-10-CM | POA: Diagnosis not present

## 2023-07-31 DIAGNOSIS — N1832 Chronic kidney disease, stage 3b: Secondary | ICD-10-CM | POA: Diagnosis not present

## 2023-07-31 DIAGNOSIS — E785 Hyperlipidemia, unspecified: Secondary | ICD-10-CM | POA: Diagnosis not present

## 2023-08-07 DIAGNOSIS — Z1331 Encounter for screening for depression: Secondary | ICD-10-CM | POA: Diagnosis not present

## 2023-08-07 DIAGNOSIS — I10 Essential (primary) hypertension: Secondary | ICD-10-CM | POA: Diagnosis not present

## 2023-08-07 DIAGNOSIS — N184 Chronic kidney disease, stage 4 (severe): Secondary | ICD-10-CM | POA: Diagnosis not present

## 2023-08-07 DIAGNOSIS — M25562 Pain in left knee: Secondary | ICD-10-CM | POA: Diagnosis not present

## 2023-08-07 DIAGNOSIS — R82998 Other abnormal findings in urine: Secondary | ICD-10-CM | POA: Diagnosis not present

## 2023-08-07 DIAGNOSIS — Z1212 Encounter for screening for malignant neoplasm of rectum: Secondary | ICD-10-CM | POA: Diagnosis not present

## 2023-08-07 DIAGNOSIS — Z1339 Encounter for screening examination for other mental health and behavioral disorders: Secondary | ICD-10-CM | POA: Diagnosis not present

## 2023-08-07 DIAGNOSIS — Z0000 Encounter for general adult medical examination without abnormal findings: Secondary | ICD-10-CM | POA: Diagnosis not present

## 2023-08-07 DIAGNOSIS — E663 Overweight: Secondary | ICD-10-CM | POA: Diagnosis not present

## 2023-08-07 DIAGNOSIS — K219 Gastro-esophageal reflux disease without esophagitis: Secondary | ICD-10-CM | POA: Diagnosis not present

## 2023-08-07 DIAGNOSIS — M48061 Spinal stenosis, lumbar region without neurogenic claudication: Secondary | ICD-10-CM | POA: Diagnosis not present

## 2023-08-07 DIAGNOSIS — E785 Hyperlipidemia, unspecified: Secondary | ICD-10-CM | POA: Diagnosis not present

## 2023-08-07 DIAGNOSIS — I5032 Chronic diastolic (congestive) heart failure: Secondary | ICD-10-CM | POA: Diagnosis not present

## 2023-08-07 DIAGNOSIS — M25561 Pain in right knee: Secondary | ICD-10-CM | POA: Diagnosis not present

## 2023-08-07 DIAGNOSIS — I130 Hypertensive heart and chronic kidney disease with heart failure and stage 1 through stage 4 chronic kidney disease, or unspecified chronic kidney disease: Secondary | ICD-10-CM | POA: Diagnosis not present

## 2023-08-27 DIAGNOSIS — M170 Bilateral primary osteoarthritis of knee: Secondary | ICD-10-CM | POA: Diagnosis not present

## 2023-09-03 DIAGNOSIS — M170 Bilateral primary osteoarthritis of knee: Secondary | ICD-10-CM | POA: Diagnosis not present

## 2023-09-10 DIAGNOSIS — M170 Bilateral primary osteoarthritis of knee: Secondary | ICD-10-CM | POA: Diagnosis not present

## 2023-09-16 ENCOUNTER — Ambulatory Visit: Payer: PPO | Admitting: Pulmonary Disease

## 2023-09-16 ENCOUNTER — Encounter: Payer: Self-pay | Admitting: Pulmonary Disease

## 2023-09-16 VITALS — BP 145/75 | HR 90 | Ht 64.0 in | Wt 221.6 lb

## 2023-09-16 DIAGNOSIS — J849 Interstitial pulmonary disease, unspecified: Secondary | ICD-10-CM

## 2023-09-16 NOTE — Patient Instructions (Signed)
VISIT SUMMARY:  During your visit, we discussed your persistent lower extremity edema (swelling in your legs) and shortness of breath. These symptoms are related to your mild congestive heart failure, a condition where your heart doesn't pump blood as well as it should. Despite your current medications, you continue to experience these symptoms. We also discussed your nonproductive cough and occasional wheezing, which are more noticeable at night. Your physical activity is limited due to breathlessness and knee issues. We also noted your family history of lung disease.  YOUR PLAN:  -SHORTNESS OF BREATH AND LOWER EXTREMITY EDEMA: We will order a high-resolution CT scan to check for interstitial lung disease, a type of lung disease that affects the tissue and space around the air sacs of the lungs. We will also plan pulmonary function tests in 3-4 months to assess how well your lungs work. If the CT scan shows any abnormalities, we will need to investigate further.  INSTRUCTIONS:  Please return for a follow-up visit in 3-4 months. In the meantime, continue taking your current medications and try to stay as active as possible within your comfort level.

## 2023-09-17 NOTE — Progress Notes (Signed)
Katherine Rush    [id number]    1946-03-06  Primary Care Physician:Aronson, Gerlene Burdock, MD  Referring Physician: Geoffry Paradise, MD [address] [place],  [place] [address]  Chief complaint: Consult for dyspnea  HPI: 77 y.o. who  has a past medical history of Allergic rhinitis, Gastroesophageal reflux disease, Gout, Helicobacter pylori gastritis, History of colon polyps, Hyperlipidemia, Hypertension, IBS (irritable bowel syndrome), Internal hemorrhoids, Lexiscan Myoview 09/2021, Non-alcoholic fatty liver disease, Spinal stenosis of lumbar region, and Tubular adenoma of colon.   Discussed the use of AI scribe software for clinical note transcription with the patient, who gave verbal consent to proceed.  The patient, with a history of mild congestive heart failure with preserved ejection fraction, presents with persistent lower extremity edema and shortness of breath. She initially attributed these symptoms to the stress of caring for her late mother, but the symptoms persisted after her passing. Cardiology workup revealed one wall of a cardiac chamber with reduced elasticity. Despite treatment with Lasix 20mg  daily and Jardiance, the patient continues to experience 'fat feet' and shortness of breath.  The patient also reports a nonproductive cough and occasional wheezing, more noticeable at night. She sleeps with her head elevated due to a combination of back issues from degenerative disc disease and a belief that it improves her breathing. She denies smoking and pet exposure but mentions a long-discarded feather pillow.  The patient's physical activity is limited due to breathlessness and knee issues. She reports significant fatigue after showering and washing her hair. She expresses a desire to exercise more but struggles with mobility due to her knees.  The patient's mother had unspecified interstitial lung disease that look like chronic hypersensitivity pneumonitis which is  treated conservatively without a biopsy.  She is concerned about a hereditary component. However, the patient is unsure if any other family members have lung disease, though she notes a family history of cardiac disease.   Pets: Occupation: Retired Exposures: No known exposures Smoking history: Never smoker Travel history: No significant travel Relevant family history: Mom   Outpatient Encounter Medications as of 09/16/2023  Medication Sig   acetaminophen (TYLENOL) 500 MG tablet Take 500 mg by mouth every 6 (six) hours as needed.   allopurinol (ZYLOPRIM) 100 MG tablet Take 100 mg by mouth daily.   carvedilol (COREG) 6.25 MG tablet TAKE 1 TABLET BY MOUTH TWICE DAILY   colchicine 0.6 MG tablet 0.3 mg.   Cyanocobalamin (VITAMIN B 12) 500 MCG TABS Take 1,000 mcg by mouth daily.   empagliflozin (JARDIANCE) 10 MG TABS tablet Take 1 tablet (10 mg total) by mouth daily before breakfast.   famotidine (PEPCID) 20 MG tablet Take 1 tablet (20 mg total) by mouth at bedtime as needed for heartburn or indigestion.   furosemide (LASIX) 40 MG tablet Take 40 mg by mouth daily. Patient take 1/2 tablet daily.   Metamucil Fiber CHEW Metamucil gummys   olmesartan (BENICAR) 40 MG tablet Take 40 mg by mouth daily. Taking 20 mg in the a.m. and 20 mg at night   omeprazole (PRILOSEC) 40 MG capsule Take 1 capsule (40 mg total) by mouth daily.   ondansetron (ZOFRAN) 4 MG tablet Take 1 tablet (4 mg total) by mouth every 8 (eight) hours as needed for nausea or vomiting.   pravastatin (PRAVACHOL) 20 MG tablet Take 1 tablet (20 mg total) by mouth at bedtime.   promethazine (PHENERGAN) 25 MG tablet Take 0.5 tablets (12.5 mg total) by mouth  every 6 (six) hours as needed for nausea or vomiting.   Vitamin E 400 units TABS Take 2 tablets by mouth in the morning.   No facility-administered encounter medications on file as of 09/16/2023.    Allergies as of 09/16/2023 - Review Complete 09/16/2023  Allergen Reaction Noted    Fish allergy  06/08/2022   Shellfish-derived products Other (See Comments) 07/05/2021   Iodine Rash    Ivp dye [iodinated contrast media] Rash 09/25/2012   Other Rash 09/25/2012   Penicillins Rash    Sulfonamide derivatives Rash     Past Medical History:  Diagnosis Date   Allergic rhinitis    Gastroesophageal reflux disease    Gout    Helicobacter pylori gastritis    History of colon polyps    Hyperlipidemia    Hypertension    IBS (irritable bowel syndrome)    Internal hemorrhoids    Lexiscan Myoview 09/2021    Myoview 10/22: no ischemia, EF 86, low risk   Non-alcoholic fatty liver disease    Spinal stenosis of lumbar region    Tubular adenoma of colon     Past Surgical History:  Procedure Laterality Date   APPENDECTOMY  1960   BREAST BIOPSY  1985   right   CATARACT EXTRACTION  07/2011   TOTAL ABDOMINAL HYSTERECTOMY W/ BILATERAL SALPINGOOPHORECTOMY  1990    Family History  Problem Relation Age of Onset   Diabetes Mother    Stroke Mother    Heart attack Mother    Heart attack Father    Cancer Maternal Aunt        ? ovarian or cervical   Breast cancer Maternal Aunt    Throat cancer Cousin        1st cousin   Breast cancer Cousin        1st cousin   Stroke Maternal Grandmother    Heart attack Maternal Grandfather    Other Other        Majority of mother's side with heart disease   Colon cancer Neg Hx     Social History   Socioeconomic History   Marital status: Single    Spouse name: Not on file   Number of children: Not on file   Years of education: Not on file   Highest education level: Not on file  Occupational History   Not on file  Tobacco Use   Smoking status: Never   Smokeless tobacco: Never  Vaping Use   Vaping status: Never Used  Substance and Sexual Activity   Alcohol use: No    Alcohol/week: 0.0 standard drinks of alcohol   Drug use: No   Sexual activity: Not on file  Other Topics Concern   Not on file  Social History Narrative    Not on file   Social Determinants of Health   Financial Resource Strain: Not on file  Food Insecurity: Not on file  Transportation Needs: Not on file  Physical Activity: Not on file  Stress: Not on file  Social Connections: Not on file  Intimate Partner Violence: Not on file    Review of systems: Review of Systems  Constitutional: Negative for fever and chills.  HENT: Negative.   Eyes: Negative for blurred vision.  Respiratory: as per HPI  Cardiovascular: Negative for chest pain and palpitations.  Gastrointestinal: Negative for vomiting, diarrhea, blood per rectum. Genitourinary: Negative for dysuria, urgency, frequency and hematuria.  Musculoskeletal: Negative for myalgias, back pain and joint pain.  Skin: Negative for itching  and rash.  Neurological: Negative for dizziness, tremors, focal weakness, seizures and loss of consciousness.  Endo/Heme/Allergies: Negative for environmental allergies.  Psychiatric/Behavioral: Negative for depression, suicidal ideas and hallucinations.  All other systems reviewed and are negative.  Physical Exam: Blood pressure (!) 145/75, pulse 90, height 5\' 4"  (1.626 m), weight 221 lb 9.6 oz (100.5 kg), SpO2 97%. Gen:      No acute distress HEENT:  EOMI, sclera anicteric Neck:     No masses; no thyromegaly Lungs:    Clear to auscultation bilaterally; normal respiratory effort CV:         Regular rate and rhythm; no murmurs Abd:      + bowel sounds; soft, non-tender; no palpable masses, no distension Ext:    No edema; adequate peripheral perfusion Skin:      Warm and dry; no rash Neuro: alert and oriented x 3 Psych: normal mood and affect  Data Reviewed: Imaging:  PFTs:  Labs:  Assessment and Plan Shortness of Breath and Lower Extremity Edema Patient reports shortness of breath and lower extremity edema. Cardiology workup revealed mild congestive heart failure with preserved ejection fraction. Patient is currently on Lasix 20mg  daily and  Jardiance. Crackles heard in the lower lungs by previous physicians. Possible interstitial lung disease suggested due to family history.  -Order high resolution CT scan to evaluate for interstitial lung disease. -Plan pulmonary function tests in 3-4 months. -If CT scan is abnormal, further investigation will be needed.  General Health Maintenance -Return for follow-up in 3-4 months.   Recommendations: High resolution CT  Chilton Greathouse MD [facility] 09/17/2023, 2:23 PM  CC: Geoffry Paradise, MD

## 2023-10-01 ENCOUNTER — Ambulatory Visit
Admission: RE | Admit: 2023-10-01 | Discharge: 2023-10-01 | Disposition: A | Discharge status: Home / Self Care | Payer: PPO | Source: Ambulatory Visit | Attending: Pulmonary Disease | Admitting: Pulmonary Disease

## 2023-10-01 DIAGNOSIS — I700 Atherosclerosis of aorta: Secondary | ICD-10-CM | POA: Diagnosis not present

## 2023-10-01 DIAGNOSIS — J849 Interstitial pulmonary disease, unspecified: Secondary | ICD-10-CM

## 2023-10-01 DIAGNOSIS — J479 Bronchiectasis, uncomplicated: Secondary | ICD-10-CM | POA: Diagnosis not present

## 2023-10-01 DIAGNOSIS — R0602 Shortness of breath: Secondary | ICD-10-CM | POA: Diagnosis not present

## 2023-10-23 DIAGNOSIS — M25561 Pain in right knee: Secondary | ICD-10-CM | POA: Diagnosis not present

## 2023-10-23 DIAGNOSIS — M25562 Pain in left knee: Secondary | ICD-10-CM | POA: Diagnosis not present

## 2023-11-13 DIAGNOSIS — M25562 Pain in left knee: Secondary | ICD-10-CM | POA: Diagnosis not present

## 2023-11-13 DIAGNOSIS — M25561 Pain in right knee: Secondary | ICD-10-CM | POA: Diagnosis not present

## 2023-11-18 ENCOUNTER — Other Ambulatory Visit: Payer: Self-pay | Admitting: Physician Assistant

## 2023-11-20 DIAGNOSIS — M25562 Pain in left knee: Secondary | ICD-10-CM | POA: Diagnosis not present

## 2023-11-20 DIAGNOSIS — M25561 Pain in right knee: Secondary | ICD-10-CM | POA: Diagnosis not present

## 2023-11-22 DIAGNOSIS — R300 Dysuria: Secondary | ICD-10-CM | POA: Diagnosis not present

## 2023-11-22 DIAGNOSIS — R350 Frequency of micturition: Secondary | ICD-10-CM | POA: Diagnosis not present

## 2023-11-22 DIAGNOSIS — N3289 Other specified disorders of bladder: Secondary | ICD-10-CM | POA: Diagnosis not present

## 2023-11-27 DIAGNOSIS — M25562 Pain in left knee: Secondary | ICD-10-CM | POA: Diagnosis not present

## 2023-11-27 DIAGNOSIS — M25561 Pain in right knee: Secondary | ICD-10-CM | POA: Diagnosis not present

## 2023-11-29 DIAGNOSIS — M25562 Pain in left knee: Secondary | ICD-10-CM | POA: Diagnosis not present

## 2023-11-29 DIAGNOSIS — M25561 Pain in right knee: Secondary | ICD-10-CM | POA: Diagnosis not present

## 2023-12-05 DIAGNOSIS — M25561 Pain in right knee: Secondary | ICD-10-CM | POA: Diagnosis not present

## 2023-12-05 DIAGNOSIS — M25562 Pain in left knee: Secondary | ICD-10-CM | POA: Diagnosis not present

## 2023-12-09 ENCOUNTER — Other Ambulatory Visit: Payer: Self-pay | Admitting: Physician Assistant

## 2023-12-09 DIAGNOSIS — M25562 Pain in left knee: Secondary | ICD-10-CM | POA: Diagnosis not present

## 2023-12-09 DIAGNOSIS — M25561 Pain in right knee: Secondary | ICD-10-CM | POA: Diagnosis not present

## 2023-12-13 DIAGNOSIS — M25561 Pain in right knee: Secondary | ICD-10-CM | POA: Diagnosis not present

## 2023-12-13 DIAGNOSIS — M25562 Pain in left knee: Secondary | ICD-10-CM | POA: Diagnosis not present

## 2023-12-19 DIAGNOSIS — M25562 Pain in left knee: Secondary | ICD-10-CM | POA: Diagnosis not present

## 2023-12-19 DIAGNOSIS — M25561 Pain in right knee: Secondary | ICD-10-CM | POA: Diagnosis not present

## 2023-12-20 ENCOUNTER — Ambulatory Visit: Payer: PPO | Admitting: Pulmonary Disease

## 2023-12-20 DIAGNOSIS — J849 Interstitial pulmonary disease, unspecified: Secondary | ICD-10-CM

## 2023-12-20 LAB — PULMONARY FUNCTION TEST
DL/VA % pred: 99 %
DL/VA: 4.05 ml/min/mmHg/L
DLCO cor % pred: 69 %
DLCO cor: 13.1 ml/min/mmHg
DLCO unc % pred: 69 %
DLCO unc: 13.1 ml/min/mmHg
FEF 25-75 Post: 1.93 L/s
FEF 25-75 Pre: 2.15 L/s
FEF2575-%Change-Post: -10 %
FEF2575-%Pred-Post: 123 %
FEF2575-%Pred-Pre: 137 %
FEV1-%Change-Post: -1 %
FEV1-%Pred-Post: 79 %
FEV1-%Pred-Pre: 80 %
FEV1-Post: 1.62 L
FEV1-Pre: 1.64 L
FEV1FVC-%Change-Post: 3 %
FEV1FVC-%Pred-Pre: 113 %
FEV6-%Change-Post: -3 %
FEV6-%Pred-Post: 71 %
FEV6-%Pred-Pre: 74 %
FEV6-Post: 1.86 L
FEV6-Pre: 1.94 L
FEV6FVC-%Pred-Post: 105 %
FEV6FVC-%Pred-Pre: 105 %
FVC-%Change-Post: -4 %
FVC-%Pred-Post: 68 %
FVC-%Pred-Pre: 71 %
FVC-Post: 1.86 L
FVC-Pre: 1.94 L
Post FEV1/FVC ratio: 87 %
Post FEV6/FVC ratio: 100 %
Pre FEV1/FVC ratio: 84 %
Pre FEV6/FVC Ratio: 100 %
RV % pred: 72 %
RV: 1.68 L
TLC % pred: 70 %
TLC: 3.58 L

## 2023-12-20 NOTE — Progress Notes (Signed)
 Full PFT performed today.

## 2023-12-20 NOTE — Patient Instructions (Signed)
 Full PFT performed today.

## 2023-12-23 ENCOUNTER — Encounter: Payer: Self-pay | Admitting: Pulmonary Disease

## 2023-12-23 ENCOUNTER — Ambulatory Visit: Payer: PPO | Admitting: Pulmonary Disease

## 2023-12-23 VITALS — BP 142/82 | HR 73 | Ht 64.0 in | Wt 220.0 lb

## 2023-12-23 DIAGNOSIS — I509 Heart failure, unspecified: Secondary | ICD-10-CM | POA: Diagnosis not present

## 2023-12-23 DIAGNOSIS — R0600 Dyspnea, unspecified: Secondary | ICD-10-CM | POA: Diagnosis not present

## 2023-12-23 MED ORDER — BUDESONIDE-FORMOTEROL FUMARATE 160-4.5 MCG/ACT IN AERO: 2.0000 | INHALATION_SPRAY | Freq: Two times a day (BID) | RESPIRATORY_TRACT | 3 refills | Status: DC

## 2023-12-23 NOTE — Progress Notes (Signed)
 Katherine Rush    [id number]    01/06/1946  Primary Care Physician:Aronson, Charlie, MD  Referring Physician: Shepard Charlie, MD [address] [place],  [place] [address]  Chief complaint: Follow up for dyspnea  HPI: 78 y.o. who  has a past medical history of Allergic rhinitis, Gastroesophageal reflux disease, Gout, Helicobacter pylori gastritis, History of colon polyps, Hyperlipidemia, Hypertension, IBS (irritable bowel syndrome), Internal hemorrhoids, Lexiscan  Myoview  09/2021, Non-alcoholic fatty liver disease, Spinal stenosis of lumbar region, and Tubular adenoma of colon.   Discussed the use of AI scribe software for clinical note transcription with the patient, who gave verbal consent to proceed.  The patient, with a history of mild congestive heart failure with preserved ejection fraction, presents with persistent lower extremity edema and shortness of breath. She initially attributed these symptoms to the stress of caring for her late mother, but the symptoms persisted after her passing. Cardiology workup revealed one wall of a cardiac chamber with reduced elasticity. Despite treatment with Lasix  20mg  daily and Jardiance , the patient continues to experience 'fat feet' and shortness of breath.  The patient also reports a nonproductive cough and occasional wheezing, more noticeable at night. She sleeps with her head elevated due to a combination of back issues from degenerative disc disease and a belief that it improves her breathing. She denies smoking and pet exposure but mentions a long-discarded feather pillow.  The patient's physical activity is limited due to breathlessness and knee issues. She reports significant fatigue after showering and washing her hair. She expresses a desire to exercise more but struggles with mobility due to her knees.  The patient's mother had unspecified interstitial lung disease that look like chronic hypersensitivity pneumonitis which is  treated conservatively without a biopsy.  She is concerned about a hereditary component. However, the patient is unsure if any other family members have lung disease, though she notes a family history of cardiac disease.   Pets: Occupation: Retired Exposures: No known exposures Smoking history: Never smoker Travel history: No significant travel Relevant family history: Mom  Interim History: Discussed the use of AI scribe software for clinical note transcription with the patient, who gave verbal consent to proceed.  The patient, with a history of asthma in childhood, presents with shortness of breath that is exacerbated by physical exertion such as taking out the trash. She describes the dyspnea as not constant, but very consistent. She has a family history of interstitial lung disease, but attributes her mother's condition to secondhand smoke exposure.  The patient also reports a history of heart failure and acknowledges that weight loss could potentially alleviate some of her symptoms. She also mentions a potential issue with her kidneys not functioning well with her heart, as told by her kidney doctor, which may be contributing to her dyspnea.    Outpatient Encounter Medications as of 12/23/2023  Medication Sig   acetaminophen (TYLENOL) 500 MG tablet Take 500 mg by mouth every 6 (six) hours as needed.   allopurinol (ZYLOPRIM) 100 MG tablet Take 100 mg by mouth daily.   carvedilol  (COREG ) 6.25 MG tablet TAKE 1 TABLET BY MOUTH TWICE DAILY   colchicine 0.6 MG tablet 0.3 mg.   Cyanocobalamin  (VITAMIN B 12) 500 MCG TABS Take 1,000 mcg by mouth daily.   empagliflozin  (JARDIANCE ) 10 MG TABS tablet TAKE 1 TABLET BY MOUTH DAILY BEFORE BREAKFAST   famotidine  (PEPCID ) 20 MG tablet Take 1 tablet (20 mg total) by mouth at bedtime as  needed for heartburn or indigestion.   furosemide  (LASIX ) 40 MG tablet TAKE 1 TABLET BY MOUTH DAILY (Patient taking differently: Take 20 mg by mouth daily.)   Metamucil  Fiber CHEW Metamucil gummys   olmesartan  (BENICAR ) 40 MG tablet Take 40 mg by mouth daily. Taking 20 mg in the a.m. and 20 mg at night   omeprazole  (PRILOSEC) 40 MG capsule Take 1 capsule (40 mg total) by mouth daily.   ondansetron  (ZOFRAN ) 4 MG tablet Take 1 tablet (4 mg total) by mouth every 8 (eight) hours as needed for nausea or vomiting.   pravastatin  (PRAVACHOL ) 20 MG tablet Take 1 tablet (20 mg total) by mouth at bedtime.   promethazine  (PHENERGAN ) 25 MG tablet Take 0.5 tablets (12.5 mg total) by mouth every 6 (six) hours as needed for nausea or vomiting.   Vitamin E 400 units TABS Take 2 tablets by mouth in the morning.   No facility-administered encounter medications on file as of 12/23/2023.   Physical Exam: Blood pressure (!) 142/82, pulse 73, height 5' 4 (1.626 m), weight 220 lb (99.8 kg), SpO2 96%. Gen:      No acute distress HEENT:  EOMI, sclera anicteric Neck:     No masses; no thyromegaly Lungs:    Clear to auscultation bilaterally; normal respiratory effort CV:         Regular rate and rhythm; no murmurs Abd:      + bowel sounds; soft, non-tender; no palpable masses, no distension Ext:    No edema; adequate peripheral perfusion Skin:      Warm and dry; no rash Neuro: alert and oriented x 3 Psych: normal mood and affect    Data Reviewed: Imaging: CT high-resolution 10/01/2023-metastatic pulmonary continuation with air trapping, also notable bronchiectasis.  No evidence of interstitial lung disease.  Cirrhosis. I have reviewed the images personally.  PFTs: 12/20/2023 FVC 1.86 [68%], FEV1 1.62 [79%], F/F87, TLC 3.58 [70%], DLCO 13.10 [69%] Mild restriction, diffusion defect  Labs:  Assessment and Plan Dyspnea Dyspnea on exertion, with activities such as taking out the trash and walking into the doctor's office. CT scan shows changes consistent with asthma, including air trapping. Pulmonary function tests show mild to moderate reduction in lung capacity, possibly due  to age but there is no evidence of interstitial lung disease on CT scan -Start Symbicort  inhaler, two puffs in the morning and two puffs in the evening.  Heart Failure Possible contribution to dyspnea. No specific changes or recommendations discussed in this visit.  Renal Dysfunction Mentioned in the context of interaction with heart failure and potential impact on weight loss. No specific changes or recommendations discussed in this visit.  Follow-up in 6 months.   Recommendations: Start Symbicort  monitor dyspnea  Lonna Coder MD [facility] 12/23/2023, 11:04 AM  CC: Shepard Ade, MD

## 2023-12-23 NOTE — Patient Instructions (Signed)
 VISIT SUMMARY:  Today, we discussed your shortness of breath, which worsens with physical activity. We reviewed your history of asthma, heart failure, and potential kidney issues. A CT scan and pulmonary function tests were performed to better understand your symptoms.  YOUR PLAN:  -DYSPNEA: Dyspnea means shortness of breath. Your shortness of breath is likely due to asthma, as shown by your CT scan and pulmonary function tests. We are starting you on a Symbicort  inhaler, which you should use two puffs in the morning and two puffs in the evening.  -HEART FAILURE: Heart failure means your heart is not pumping blood as well as it should. This can contribute to your shortness of breath. No specific changes or recommendations were made during this visit.  -RENAL DYSFUNCTION: Renal dysfunction means your kidneys are not working as well as they should. This can interact with your heart condition and affect your weight. No specific changes or recommendations were made during this visit.  INSTRUCTIONS:  Please follow up in 6 months for a re-evaluation of your symptoms and treatment plan.

## 2023-12-24 DIAGNOSIS — M25562 Pain in left knee: Secondary | ICD-10-CM | POA: Diagnosis not present

## 2023-12-24 DIAGNOSIS — M25561 Pain in right knee: Secondary | ICD-10-CM | POA: Diagnosis not present

## 2023-12-25 DIAGNOSIS — R0602 Shortness of breath: Secondary | ICD-10-CM | POA: Diagnosis not present

## 2023-12-25 DIAGNOSIS — K760 Fatty (change of) liver, not elsewhere classified: Secondary | ICD-10-CM | POA: Diagnosis not present

## 2023-12-25 DIAGNOSIS — I5030 Unspecified diastolic (congestive) heart failure: Secondary | ICD-10-CM | POA: Diagnosis not present

## 2023-12-25 DIAGNOSIS — I129 Hypertensive chronic kidney disease with stage 1 through stage 4 chronic kidney disease, or unspecified chronic kidney disease: Secondary | ICD-10-CM | POA: Diagnosis not present

## 2023-12-25 DIAGNOSIS — N184 Chronic kidney disease, stage 4 (severe): Secondary | ICD-10-CM | POA: Diagnosis not present

## 2023-12-26 DIAGNOSIS — M25562 Pain in left knee: Secondary | ICD-10-CM | POA: Diagnosis not present

## 2023-12-26 DIAGNOSIS — M25561 Pain in right knee: Secondary | ICD-10-CM | POA: Diagnosis not present

## 2023-12-27 ENCOUNTER — Telehealth: Payer: Self-pay

## 2023-12-27 ENCOUNTER — Telehealth: Payer: Self-pay | Admitting: Pulmonary Disease

## 2023-12-27 ENCOUNTER — Other Ambulatory Visit (HOSPITAL_COMMUNITY): Payer: Self-pay

## 2023-12-27 NOTE — Telephone Encounter (Signed)
PA request has been Started. New Encounter created for follow up. For additional info see Pharmacy Prior Auth telephone encounter from 01/17.

## 2023-12-27 NOTE — Telephone Encounter (Signed)
*  Pulm  Pharmacy Patient Advocate Encounter   Received notification from Pt Calls Messages that prior authorization for Symbicort 160-4.5MCG/ACT aerosol  is required/requested.  CMM Key: [employer] verification completed.   The patient is insured through [facility] ADVANTAGE/RX ADVANCE .   Per test claim:  Generic Advair Diskus/Breo Ellipta/Advair HFA is preferred by the insurance.  If suggested medication is appropriate, Please send in a new RX and discontinue this one. If not, please advise as to why it's not appropriate so that we may request a Prior Authorization. Please note, some preferred medications may still require a PA   Generic Advair Diskus- $5.00 Breo Ellipta- $47.00 Advair HFA- $47.00

## 2023-12-27 NOTE — Telephone Encounter (Signed)
Patient states Symbicort needs prior authorization. Pharmacy is [facility]. Patient phone number is [phone].

## 2023-12-30 DIAGNOSIS — M25562 Pain in left knee: Secondary | ICD-10-CM | POA: Diagnosis not present

## 2023-12-30 DIAGNOSIS — M25561 Pain in right knee: Secondary | ICD-10-CM | POA: Diagnosis not present

## 2024-01-02 DIAGNOSIS — M25562 Pain in left knee: Secondary | ICD-10-CM | POA: Diagnosis not present

## 2024-01-02 DIAGNOSIS — M25561 Pain in right knee: Secondary | ICD-10-CM | POA: Diagnosis not present

## 2024-01-06 DIAGNOSIS — M25562 Pain in left knee: Secondary | ICD-10-CM | POA: Diagnosis not present

## 2024-01-06 DIAGNOSIS — M25561 Pain in right knee: Secondary | ICD-10-CM | POA: Diagnosis not present

## 2024-01-10 DIAGNOSIS — M25561 Pain in right knee: Secondary | ICD-10-CM | POA: Diagnosis not present

## 2024-01-10 DIAGNOSIS — M25562 Pain in left knee: Secondary | ICD-10-CM | POA: Diagnosis not present

## 2024-01-13 DIAGNOSIS — M25562 Pain in left knee: Secondary | ICD-10-CM | POA: Diagnosis not present

## 2024-01-13 DIAGNOSIS — M25561 Pain in right knee: Secondary | ICD-10-CM | POA: Diagnosis not present

## 2024-02-18 ENCOUNTER — Ambulatory Visit: Payer: PPO | Admitting: Internal Medicine

## 2024-02-18 ENCOUNTER — Other Ambulatory Visit (INDEPENDENT_AMBULATORY_CARE_PROVIDER_SITE_OTHER)

## 2024-02-18 ENCOUNTER — Encounter: Payer: Self-pay | Admitting: Internal Medicine

## 2024-02-18 VITALS — BP 116/68 | HR 72 | Ht 64.0 in | Wt 221.6 lb

## 2024-02-18 DIAGNOSIS — I509 Heart failure, unspecified: Secondary | ICD-10-CM | POA: Diagnosis not present

## 2024-02-18 DIAGNOSIS — K760 Fatty (change of) liver, not elsewhere classified: Secondary | ICD-10-CM

## 2024-02-18 DIAGNOSIS — E538 Deficiency of other specified B group vitamins: Secondary | ICD-10-CM

## 2024-02-18 DIAGNOSIS — M179 Osteoarthritis of knee, unspecified: Secondary | ICD-10-CM

## 2024-02-18 DIAGNOSIS — K219 Gastro-esophageal reflux disease without esophagitis: Secondary | ICD-10-CM | POA: Diagnosis not present

## 2024-02-18 LAB — CBC WITH DIFFERENTIAL/PLATELET
Basophils Absolute: 0.1 10*3/uL (ref 0.0–0.1)
Basophils Relative: 0.8 % (ref 0.0–3.0)
Eosinophils Absolute: 0.4 10*3/uL (ref 0.0–0.7)
Eosinophils Relative: 4.9 % (ref 0.0–5.0)
HCT: 41.1 % (ref 36.0–46.0)
Hemoglobin: 13.4 g/dL (ref 12.0–15.0)
Lymphocytes Relative: 26.9 % (ref 12.0–46.0)
Lymphs Abs: 2 10*3/uL (ref 0.7–4.0)
MCHC: 32.7 g/dL (ref 30.0–36.0)
MCV: 94.9 fl (ref 78.0–100.0)
Monocytes Absolute: 0.8 10*3/uL (ref 0.1–1.0)
Monocytes Relative: 10.4 % (ref 3.0–12.0)
Neutro Abs: 4.3 10*3/uL (ref 1.4–7.7)
Neutrophils Relative %: 57 % (ref 43.0–77.0)
Platelets: 173 10*3/uL (ref 150.0–400.0)
RBC: 4.33 Mil/uL (ref 3.87–5.11)
RDW: 15.1 % (ref 11.5–15.5)
WBC: 7.6 10*3/uL (ref 4.0–10.5)

## 2024-02-18 LAB — COMPREHENSIVE METABOLIC PANEL
ALT: 19 U/L (ref 0–35)
AST: 22 U/L (ref 0–37)
Albumin: 4.5 g/dL (ref 3.5–5.2)
Alkaline Phosphatase: 107 U/L (ref 39–117)
BUN: 39 mg/dL — ABNORMAL HIGH (ref 6–23)
CO2: 29 meq/L (ref 19–32)
Calcium: 9.4 mg/dL (ref 8.4–10.5)
Chloride: 104 meq/L (ref 96–112)
Creatinine, Ser: 2.03 mg/dL — ABNORMAL HIGH (ref 0.40–1.20)
GFR: 23.25 mL/min — ABNORMAL LOW (ref >60.00)
Glucose, Bld: 88 mg/dL (ref 70–99)
Potassium: 4.8 meq/L (ref 3.5–5.1)
Sodium: 141 meq/L (ref 135–145)
Total Bilirubin: 0.5 mg/dL (ref 0.2–1.2)
Total Protein: 7.3 g/dL (ref 6.0–8.3)

## 2024-02-18 LAB — PROTIME-INR
INR: 1.1 ratio — ABNORMAL HIGH (ref 0.8–1.0)
Prothrombin Time: 11.7 s (ref 9.6–13.1)

## 2024-02-18 NOTE — Patient Instructions (Addendum)
 Your provider has requested that you go to the basement level for lab work before leaving today. Press "B" on the elevator. The lab is located at the first door on the left as you exit the elevator.  Continue Vitamin E , Prilosec, and Vitamin B12.  _______________________________________________________  If your blood pressure at your visit was 140/90 or greater, please contact your primary care physician to follow up on this.  _______________________________________________________  If you are age 78 or older, your body mass index should be between 23-30. Your Body mass index is 38.04 kg/m. If this is out of the aforementioned range listed, please consider follow up with your Primary Care Provider.  If you are age 32 or younger, your body mass index should be between 19-25. Your Body mass index is 38.04 kg/m. If this is out of the aformentioned range listed, please consider follow up with your Primary Care Provider.   ________________________________________________________  The [facility] providers would like to encourage you to use [facility] to communicate with providers for non-urgent requests or questions.  Due to long hold times on the telephone, sending your provider a message by [facility] may be a faster and more efficient way to get a response.  Please allow 48 business hours for a response.  Please remember that this is for non-urgent requests.  _______________________________________________________

## 2024-02-19 ENCOUNTER — Encounter: Payer: Self-pay | Admitting: Internal Medicine

## 2024-02-19 NOTE — Progress Notes (Signed)
 Subjective:    Patient ID: Katherine Rush, female    DOB: [birth year], 78 y.o.   MRN: [id number]  HPI Karl Knarr is a 78 year old female with a history of GERD, prior gastritis and shallow gastric ulcer with H. pylori treated with Pylera (eradication confirmed by stool test), SIBO after positive breath test, MASLD, history of colon polyps who is here for follow-up.  She is here alone today and was last seen on 06/26/2023.  She has a history of metabolic fatty liver disease with a fib-4 score of 2.45, which is indeterminate. Recent liver function tests show AST of 26, ALT of 23, platelets at 168, and INR of 1.2. An ultrasound with elastography indicated a median kilopascal of 7.6. No symptoms such as ascites or jaundice. She is currently taking vitamin E at a dose of 800 IU daily, divided into two doses, and is trying to manage her sugar intake. She mentions a past physical where her fasting sugar was slightly elevated, prompting her to attempt dietary changes. However, she finds it challenging to maintain these changes due to cold weather and holidays. She is cautious about using Tylenol for knee pain due to potential liver impact. She also mentions a past CT scan of her chest that suggested cirrhosis, but she questions this diagnosis given her normal liver function tests and lack of significant symptoms.  She experiences knee pain, describing it as 'a butcher knife in my knee,' and notes that it is almost bone on bone. This pain limits her ability to exercise, impacting her weight management efforts. She has undergone eight weeks of physical therapy with variable results and is using herbal patches for pain relief. She has not yet seen a surgeon but has consulted with a physician assistant.  She has a history of mild chronic heart failure and chronic kidney disease, which are being managed by her cardiologist and nephrologist, respectively. She is on Jardiance, prescribed for her heart  condition. She is attempting to manage her weight and blood sugar levels, which have been slightly elevated.   Review of Systems As per HPI, otherwise negartive  Current Medications, Allergies, Past Medical History, Past Surgical History, Family History and Social History were reviewed in [employer] record.    Objective:   Physical Exam BP 116/68   Pulse 72   Ht 5\' 4"  (1.626 m)   Wt 221 lb 9.6 oz (100.5 kg)   BMI 38.04 kg/m  Gen: awake, alert, NAD HEENT: anicteric  Neuro: nonfocal  LABS Fib-4 Score: 2.45 AST: 26 ALT: 23 Platelets: 168 INR: 1.2  RADIOLOGY Ultrasound with elastography: Median kilopascals 7.6; Diagnostic category less than 9 kilopascals in the absence of other known clinical signs rules out advanced liver disease (06/05/2023)  CT CHEST WITHOUT CONTRAST   TECHNIQUE: Multidetector CT imaging of the chest was performed following the standard protocol without intravenous contrast. High resolution imaging of the lungs, as well as inspiratory and expiratory imaging, was performed.   RADIATION DOSE REDUCTION: This exam was performed according to the departmental dose-optimization program which includes automated exposure control, adjustment of the mA and/or kV according to patient size and/or use of iterative reconstruction technique.   COMPARISON:  None Available.   FINDINGS: Cardiovascular: Atherosclerotic calcification of the aorta, aortic valve and coronary arteries. Heart is enlarged. No pericardial effusion.   Mediastinum/Nodes: Subcentimeter low-attenuation lesion in the right thyroid. No follow-up recommended. (Ref: J Am Coll Radiol. 2015 Feb;12(2): 143-50).No pathologically enlarged mediastinal or axillary lymph nodes. Hilar  regions are difficult to definitively evaluate without IV contrast. Esophagus is grossly unremarkable.   Lungs/Pleura: Negative for subpleural reticulation, traction bronchiectasis/bronchiolectasis,  ground glass, architectural distortion or honeycombing. Mosaic pulmonary parenchymal attenuation with air trapping. Cylindrical bronchiectasis. No pleural fluid. Airway is unremarkable.   Upper Abdomen: Liver margin is irregular. Slight nodular thickening of the left adrenal gland. No specific follow-up necessary. Scarring in the kidneys. Visualized portions of the liver, gallbladder, adrenal glands, kidneys, spleen, pancreas, stomach and bowel are otherwise grossly unremarkable. No upper abdominal adenopathy.   Musculoskeletal: Degenerative changes in the spine.   IMPRESSION: 1. No evidence of fibrotic interstitial lung disease. 2. Mosaic pulmonary parenchymal attenuation with air trapping, indicative of small airways disease. 3. Cylindrical bronchiectasis. 4. Cirrhosis. 5. Aortic atherosclerosis (ICD10-I70.0). Coronary artery calcification.     Electronically Signed   By: Leanna Battles M.D.   On: 10/18/2023 10:07       Assessment & Plan:  Metabolic Associated Steatotic Liver Disease (MASLD) Fib-4 score indeterminate. Ultrasound elastography median 7.6 kPa, no advanced liver disease. Normal AST, ALT, platelets, INR near normal.  Alb normal. Discussed GLP-1 agonists for weight loss, potential liver benefits. Rezdiffra discussed for fibrosis reduction if F2/F3 confirmed. Liver biopsy considered for fibrosis assessment. - Continue vitamin E 800 IU daily. - Discuss Ozempic with cardiology for weight loss and liver health. - Consider liver biopsy for fibrosis stage assessment if necessary. - Order CBC, CMP, INR tests today.  Congestive Heart Failure (CHF) Mild CHF, on Jardiance. Discussed Ozempic with Jardiance for weight and heart improvement. - Discuss Ozempic with cardiology in conjunction with Jardiance.  Chronic Kidney Disease Well-managed, followed by nephrologist.  Knee Osteoarthritis Significant knee pain, hesitant about surgery, using herbal patches. Tylenol up to  2 grams per day is safe. - Consider Tylenol up to 2 grams per day for knee pain management.  GERD History of H. pylori ulcer status posttreatment.  Some dyspeptic symptoms controlled with omeprazole -Continue omeprazole 40 mg daily  B12 deficiency Improved on oral B12, continue oral B12 daily

## 2024-02-24 ENCOUNTER — Encounter: Payer: Self-pay | Admitting: Internal Medicine

## 2024-02-25 ENCOUNTER — Telehealth: Payer: Self-pay | Admitting: Internal Medicine

## 2024-02-25 NOTE — Telephone Encounter (Signed)
 Recall placed to contact patient in 6 months. Patient's appt cancelled.

## 2024-02-25 NOTE — Telephone Encounter (Signed)
 Patient called stating she had missed a call from [facility], she stated that an appointment had been made for her with Dr. Rhea Belton in June and he advised he could see her again in 6 months. Patient stated that she would be okay to cancel the appointment and wait to hear from [place] for September.

## 2024-03-03 ENCOUNTER — Telehealth: Payer: Self-pay | Admitting: Pulmonary Disease

## 2024-03-03 NOTE — Telephone Encounter (Signed)
 Patient checking on prior authorization for Symbicort. Patient phone number is [phone].

## 2024-03-04 ENCOUNTER — Telehealth: Payer: Self-pay | Admitting: *Deleted

## 2024-03-04 NOTE — Telephone Encounter (Signed)
 Copied from CRM [id number]. Topic: Clinical - Medication Question >> Mar 04, 2024  9:47 AM Nila Nephew wrote: Reason for CRM: Patient returning call regarding PA for Symbicort. Informed patient that currently, we are awaiting provider input for the PA based on the information also listed in the message. Patient understanding and grateful for the swift return call, even though she missed it.

## 2024-03-04 NOTE — Telephone Encounter (Signed)
 Lm for patient.  Dr. Isaiah Serge, please see below message from pharmacy team.  *Pulm   Pharmacy Patient Advocate Encounter   Received notification from Pt Calls Messages that prior authorization for Symbicort 160-4.5MCG/ACT aerosol  is required/requested.   CMM Key: [employer] verification completed.   The patient is insured through [facility] ADVANTAGE/RX ADVANCE .   Per test claim:  Generic Advair Diskus/Breo Ellipta/Advair HFA is preferred by the insurance.  If suggested medication is appropriate, Please send in a new RX and discontinue this one. If not, please advise as to why it's not appropriate so that we may request a Prior Authorization. Please note, some preferred medications may still require a PA    Generic Advair Diskus- $5.00 Breo Ellipta- $47.00 Advair HFA- $47.00

## 2024-03-06 ENCOUNTER — Telehealth: Payer: Self-pay | Admitting: *Deleted

## 2024-03-06 NOTE — Telephone Encounter (Signed)
-----   Message from Carie Caddy Pyrtle sent at 03/05/2024  6:24 PM EDT ----- Please let patient know that right now cardiology does not think she will qualify to have a GLP-1 medication covered We expect fatty liver indication for GLP-1 later this year at which point it will be covered JMP ----- Message ----- From: Olene Floss, RPH-CPP Sent: 02/20/2024   4:24 PM EDT To: Beatrice Lecher, PA-C; Beverley Fiedler, MD; #  There is good data for HF w/ GLP-1 with or without DM but its not FDA approved for this, so insurance wont pay. Medicare can cover [facility] for CAD (but they typically restrict that to pt with hx of MI, CVA or PAD). We can often get coverage of Zepbound for OSA. But I dont see a diagnosis of this in her chart. I also dont think that health team advantage will cover these very well at all. There are cash price options but they range from $350-650/month.  I agree GLP-1 is a good medication choice for this patient and her fatty liver, but it will cost her a good amount of money. ----- Message ----- From: Kennon Rounds Sent: 02/20/2024   9:33 AM EDT To: Beverley Fiedler, MD; Vesta Mixer, MD; #  Hi Dr. Rhea Belton There is definitely a CV benefit in patients with chronic coronary disease (she has a chest CT in her chart that shows coronary artery calcifications). There is some thought about benefits in patients with HFpEF. But it's not clear what effects loss of muscle mass will have. Either way, with CAD or CHF, the indication is usually for patients with concomitant diabetes mellitus. Insurance usually won't cover it if they don't have diabetes. I don't see diabetes listed in her problem list or PMH. I will have our PharmD look at her coverage to see if it is feasible for her.  Melissa - Can you see what coverage she would have for Semaglutide (or any other GLP1a)? Thanks, Acupuncturist ----- Message ----- From: Beverley Fiedler, MD Sent: 02/19/2024   4:55 PM EDT To: Beatrice Lecher, PA-C; Vesta Mixer, MD  Mervin Kung I saw Katherine Rush this week.  She was started on Jardiance by you several months ago.  Seems to be tolerating it.  Would like to know your thoughts about possibly Ozempic for her.  She has significant metabolic steatotic liver disease and has been unable to lose meaningful weight.  Ozempic is still not yet approved, then may be soon, for metabolic fatty liver.  I doubt I could get it proved for that reason.  Would there be a reason for cardiology to prescribe a GLP-1 for her?  I am trying to decide the best way to mitigate her fatty liver which seems to be moderate though she does not have definitive portal hypertension yet.  I even considered a liver biopsy.  Thanks for your thoughts Carie Caddy. Pyrtle, M.D.  02/19/2024

## 2024-03-06 NOTE — Telephone Encounter (Signed)
 I have left a message on patient's voicemail advising that as of right now, cardiology does not feel she would qualify for coverage of GLP-1 medication. However, we are hoping those medications will be covered later in the year once GLP-1's get an indication in the treatment of fatty liver disease as expected at some point this year. Advised to call back with any questions.

## 2024-03-10 ENCOUNTER — Telehealth: Payer: Self-pay | Admitting: Pharmacist

## 2024-03-10 NOTE — Telephone Encounter (Signed)
-----   Message from Tereso Newcomer sent at 03/05/2024  7:37 AM EDT ----- Glenford Bayley cannot remember if I asked you this already. Can you reach out to her and go over the options for GLP1a's and see if she is able to afford it, willing to try it? Scott ----- Message ----- From: Olene Floss, RPH-CPP Sent: 02/20/2024   4:24 PM EDT To: Beatrice Lecher, PA-C; Beverley Fiedler, MD; #  There is good data for HF w/ GLP-1 with or without DM but its not FDA approved for this, so insurance wont pay. Medicare can cover [facility] for CAD (but they typically restrict that to pt with hx of MI, CVA or PAD). We can often get coverage of Zepbound for OSA. But I dont see a diagnosis of this in her chart. I also dont think that health team advantage will cover these very well at all. There are cash price options but they range from $350-650/month.  I agree GLP-1 is a good medication choice for this patient and her fatty liver, but it will cost her a good amount of money. ----- Message ----- From: Kennon Rounds Sent: 02/20/2024   9:33 AM EDT To: Beverley Fiedler, MD; Vesta Mixer, MD; #  Hi Dr. Rhea Belton There is definitely a CV benefit in patients with chronic coronary disease (she has a chest CT in her chart that shows coronary artery calcifications). There is some thought about benefits in patients with HFpEF. But it's not clear what effects loss of muscle mass will have. Either way, with CAD or CHF, the indication is usually for patients with concomitant diabetes mellitus. Insurance usually won't cover it if they don't have diabetes. I don't see diabetes listed in her problem list or PMH. I will have our PharmD look at her coverage to see if it is feasible for her.  Patty Leitzke - Can you see what coverage she would have for Semaglutide (or any other GLP1a)? Thanks, Acupuncturist ----- Message ----- From: Beverley Fiedler, MD Sent: 02/19/2024   4:55 PM EDT To: Beatrice Lecher, PA-C; Vesta Mixer, MD  Mervin Kung I saw Ms.  Antonelli this week.  She was started on Jardiance by you several months ago.  Seems to be tolerating it.  Would like to know your thoughts about possibly Ozempic for her.  She has significant metabolic steatotic liver disease and has been unable to lose meaningful weight.  Ozempic is still not yet approved, then may be soon, for metabolic fatty liver.  I doubt I could get it proved for that reason.  Would there be a reason for cardiology to prescribe a GLP-1 for her?  I am trying to decide the best way to mitigate her fatty liver which seems to be moderate though she does not have definitive portal hypertension yet.  I even considered a liver biopsy.  Thanks for your thoughts Carie Caddy. Pyrtle, M.D.  02/19/2024

## 2024-03-10 NOTE — Telephone Encounter (Signed)
 Called patient to discuss GLP-1 options.  Only option is [employer] for [employer].  Prices range from [phone] depending on dose.  Patient cannot afford this on top of the cost of her Symbicort and Jardiance.  She states that she has started going to the [facility] in [place] which has a machine that does not bother her knees, although she states she has not been in 2 weeks and could be better about that.  She is thinking about joining weight watchers so that she has someone to hold her accountable.  Encouraged patient to make a few small changes at a time.  Start exercising more consistently.  Patient very grateful for the phone call and explanation about GLP-1's.

## 2024-03-12 MED ORDER — FLUTICASONE-SALMETEROL 500-50 MCG/ACT IN AEPB: 1.0000 | INHALATION_SPRAY | Freq: Two times a day (BID) | RESPIRATORY_TRACT | 5 refills | Status: DC

## 2024-03-12 NOTE — Telephone Encounter (Signed)
 Please send prescription for generic Advair 500/50.  Take 1 tablet twice daily

## 2024-03-12 NOTE — Telephone Encounter (Signed)
 Adviar has been sent to preferred pharmacy/ Pt is aware and voiced her understanding.  Nothing further needed.

## 2024-05-06 ENCOUNTER — Other Ambulatory Visit: Payer: Self-pay | Admitting: Cardiovascular Disease

## 2024-05-14 ENCOUNTER — Other Ambulatory Visit: Payer: Self-pay

## 2024-05-14 MED ORDER — EMPAGLIFLOZIN 10 MG PO TABS: 10.0000 mg | ORAL_TABLET | Freq: Every day | ORAL | 0 refills | Status: DC

## 2024-05-15 ENCOUNTER — Encounter: Payer: Self-pay | Admitting: Cardiovascular Disease

## 2024-05-15 NOTE — Telephone Encounter (Signed)
 Error

## 2024-05-18 ENCOUNTER — Ambulatory Visit: Admitting: Internal Medicine

## 2024-06-01 NOTE — Progress Notes (Signed)
 OFFICE NOTE:    Date:  06/02/2024  ID:  Katherine Rush, DOB [birth year], MRN [id number] PCP: Shepard Ade, MD  [facility] Providers Cardiologist:  Jerel Balding, MD       Patient Profile:  HFpEF) heart failure with preserved ejection fraction  Echocardiogram 11/17/20: EF 60-65, Gr 1 DD, mild LVH, normal RVSF, trivial MR, AV sclerosis without stenosis, RAP 3 Coronary artery Ca2+ (CT in 09/2023) Myoview  09/21/21: EF 86, no ischemia Hypertension  Hyperlipidemia  Chronic kidney disease  Neph: Dr. Gearline Chronic back pain due to spinal stenosis + DDD FHx of CAD  Morbid obesity  Chronic leg edema  GERD Hepatic steatosis (CT in 9/21) Aortic atherosclerosis       Discussed the use of AI scribe software for clinical note transcription with the patient, who gave verbal consent to proceed. History of Present Illness Katherine Rush is a 78 y.o. female who returns for follow up of CHF, CAC. She was last seen by Dr. Alveta in 05/2023.   She has persistent shortness of breath despite treatment. An echocardiogram in December 2021 showed 60-65% ejection fraction and grade one diastolic dysfunction. Pulmonary function tests and a CT of the chest suggested possible asthma, and she was started on Advair, which improved wheezing but not shortness of breath. She has not had chest discomfort, heaviness, pressure, jaw pain, or arm pain associated with her shortness of breath. She reports a history of rapid heartbeat, particularly upon waking, with increasing frequency to about once a week. She denies taking extra Lasix  for swelling and notes a recent weight gain of two and a half pounds, but not consistently over three pounds in a day. She has a history of pravastatin  use, which she stopped due to joint aches and pains. Her symptoms did not improve with this.    ROS-See HPI    Studies Reviewed:  EKG Interpretation Date/Time:  Tuesday June 02 2024 13:38:46 EDT Ventricular Rate:   76 PR Interval:  142 QRS Duration:  70 QT Interval:  366 QTC Calculation: 411 R Axis:   68  Text Interpretation: Normal sinus rhythm Cannot rule out Anterior infarct , age undetermined When compared with ECG of 28-May-2023 11:14, No significant change was found Confirmed by Lelon Hamilton [phone]) on 06/02/2024 1:47:35 PM   Results Labs-chart review  07/31/2023: Total cholesterol 152, HDL 36, LDL 94, triglycerides 110 6/88/7974: ALT 19, hemoglobin 13.4, creatinine 2.03, potassium 4 point  Risk Assessment/Calculations:          Physical Exam:  VS:  BP 136/80   Pulse 76   Ht 5' 3 (1.6 m)   Wt 220 lb (99.8 kg)   SpO2 96%   BMI 38.97 kg/m        Wt Readings from Last 3 Encounters:  06/02/24 220 lb (99.8 kg)  02/18/24 221 lb 9.6 oz (100.5 kg)  12/23/23 220 lb (99.8 kg)    Constitutional:      Appearance: Healthy appearance. Not in distress.  Neck:     Vascular: JVD normal.  Pulmonary:     Breath sounds: Normal breath sounds. No wheezing. No rales.  Cardiovascular:     Normal rate. Regular rhythm.     Murmurs: There is no murmur.  Edema:    Peripheral edema absent.  Abdominal:     Palpations: Abdomen is soft.        Assessment and Plan:    Assessment & Plan Chronic heart failure with preserved ejection fraction (HCC)  EF 60-65 by echocardiogram December 2021.  She has continued dyspnea with exertion.  She describes NYHA IIb-III symptoms. I suspect her dyspnea is multifactorial including CHF, reactive airways disease, obesity, deconditioning. She does not appear to be volume overloaded on exam.  She has seen pulmonology for evaluation and diagnosed with possible asthma.  She has noted some improvement with inhaled medications.  Given her chronic kidney disease, I would not significantly increase her diuretics on that she is obviously volume overloaded.  She does have several risk factors for cardiac amyloid including bilateral carpal tunnel syndrome, spinal stenosis and  LVH. -Continue Lasix  40 mg daily, Jardiance  10 mg daily -Obtain follow-up echocardiogram -Arrange PYP scan, SPEP/UPEP to rule out cardiac amyloid - Follow-up 6 months Coronary artery calcification Nuclear stress test in 2022 negative for ischemia.  She has not had chest discomfort to suggest angina.  She would be high risk for contrast-induced nephropathy given CKD.  I would hold off on cardiac catheterization in her unless she has high risk features on stress testing.  At this point, I do not think she needs ischemic evaluation.  She previously stopped pravastatin  secondary to myalgias and arthralgias.  These did not improve with stopping pravastatin . -Resume pravastatin  20 mg daily. Essential hypertension Blood pressure controlled. -Continue carvedilol  6.25 mg twice daily, olmesartan  20 mg twice daily Stage 3a chronic kidney disease (HCC) Continue follow-up with nephrology as planned. Palpitations She notes rapid palpitations, increasing in frequency recently. -Obtain ZIO XT to rule out atrial fibrillation Bilateral carpal tunnel syndrome Left ventricular hypertrophy Mild LVH on previous echocardiogram.  She has a history of bilateral carpal tunnel syndrome as well as spinal stenosis and CHF.  Arrange PYP scan, SPEP/UPEP to rule out cardiac amyloid. Obesity (BMI 30-39.9) She has considered GLP1 agonists in the past but insurance will not cover it. If she decides to pursue this, I will set her up with our PharmD clinic. We discussed the importance of weight loss in CHF management.         Dispo:  Return in about 6 months (around 12/02/2024) for Routine Follow Up w/ Dr. Francyne.  Signed, Glendia Ferrier, PA-C

## 2024-06-02 ENCOUNTER — Ambulatory Visit: Attending: Physician Assistant

## 2024-06-02 ENCOUNTER — Ambulatory Visit: Attending: Physician Assistant | Admitting: Physician Assistant

## 2024-06-02 ENCOUNTER — Encounter: Payer: Self-pay | Admitting: Physician Assistant

## 2024-06-02 VITALS — BP 136/80 | HR 76 | Ht 63.0 in | Wt 220.0 lb

## 2024-06-02 DIAGNOSIS — E669 Obesity, unspecified: Secondary | ICD-10-CM

## 2024-06-02 DIAGNOSIS — I5032 Chronic diastolic (congestive) heart failure: Secondary | ICD-10-CM | POA: Diagnosis not present

## 2024-06-02 DIAGNOSIS — I2510 Atherosclerotic heart disease of native coronary artery without angina pectoris: Secondary | ICD-10-CM

## 2024-06-02 DIAGNOSIS — N1831 Chronic kidney disease, stage 3a: Secondary | ICD-10-CM

## 2024-06-02 DIAGNOSIS — R002 Palpitations: Secondary | ICD-10-CM

## 2024-06-02 DIAGNOSIS — I10 Essential (primary) hypertension: Secondary | ICD-10-CM | POA: Diagnosis not present

## 2024-06-02 DIAGNOSIS — E7800 Pure hypercholesterolemia, unspecified: Secondary | ICD-10-CM

## 2024-06-02 DIAGNOSIS — G5603 Carpal tunnel syndrome, bilateral upper limbs: Secondary | ICD-10-CM | POA: Diagnosis not present

## 2024-06-02 DIAGNOSIS — I517 Cardiomegaly: Secondary | ICD-10-CM

## 2024-06-02 MED ORDER — CARVEDILOL 6.25 MG PO TABS: 6.2500 mg | ORAL_TABLET | Freq: Two times a day (BID) | ORAL | 3 refills | Status: AC

## 2024-06-02 NOTE — Assessment & Plan Note (Signed)
 Blood pressure controlled. -Continue carvedilol  6.25 mg twice daily, olmesartan  20 mg twice daily

## 2024-06-02 NOTE — Assessment & Plan Note (Signed)
 Continue follow-up with nephrology as planned.

## 2024-06-02 NOTE — Patient Instructions (Addendum)
 Medication Instructions:  Your physician recommends that you continue on your current medications as directed. Please refer to the Current Medication list given to you today.  Start the Pravastatin  back   *If you need a refill on your cardiac medications before your next appointment, please call your pharmacy*  Lab Work: TODAY:  UPEP & SPEP  If you have labs (blood work) drawn today and your tests are completely normal, you will receive your results only by: MyChart Message (if you have MyChart) OR A paper copy in the mail If you have any lab test that is abnormal or we need to change your treatment, we will call you to review the results.  Testing/Procedures: Your physician has requested that you have an echocardiogram. Echocardiography is a painless test that uses sound waves to create images of your heart. It provides your doctor with information about the size and shape of your heart and how well your heart's chambers and valves are working. This procedure takes approximately one hour. There are no restrictions for this procedure. Please do NOT wear cologne, perfume, aftershave, or lotions (deodorant is allowed). Please arrive 15 minutes prior to your appointment time.  Please note: We ask at that you not bring children with you during ultrasound (echo/ vascular) testing. Due to room size and safety concerns, children are not allowed in the ultrasound rooms during exams. Our front office staff cannot provide observation of children in our lobby area while testing is being conducted. An adult accompanying a patient to their appointment will only be allowed in the ultrasound room at the discretion of the ultrasound technician under special circumstances. We apologize for any inconvenience.   Your physician recommends that you have a Amyloid PYP Scan.  They will reach out to you to arrange this.   ZIO XT- Long Term Monitor Instructions  Your physician has requested you wear a ZIO patch  monitor for 14 days.  This is a single patch monitor. [employer] supplies one patch monitor per enrollment. Additional stickers are not available. Please do not apply patch if you will be having a Nuclear Stress Test,  Echocardiogram, Cardiac CT, MRI, or Chest Xray during the period you would be wearing the  monitor. The patch cannot be worn during these tests. You cannot remove and re-apply the  ZIO XT patch monitor.  Your ZIO patch monitor will be mailed 3 day USPS to your address on file. It may take 3-5 days  to receive your monitor after you have been enrolled.  Once you have received your monitor, please review the enclosed instructions. Your monitor  has already been registered assigning a specific monitor serial # to you.  Billing and Patient Assistance Program Information  We have supplied [employer] with any of your insurance information on file for billing purposes. [employer] offers a sliding scale Patient Assistance Program for patients that do not have  insurance, or whose insurance does not completely cover the cost of the ZIO monitor.  You must apply for the Patient Assistance Program to qualify for this discounted rate.  To apply, please call [employer] at [phone], select option 4, select option 2, ask to apply for  Patient Assistance Program. Meredeth will ask your household income, and how many people  are in your household. They will quote your out-of-pocket cost based on that information.  [employer] will also be able to set up a 75-month, interest-free payment plan if needed.  Applying the monitor   Shave hair from upper left chest.  Hold abrader disc by orange tab. Rub abrader in 40 strokes over the upper left chest as  indicated in your monitor instructions.  Clean area with 4 enclosed alcohol  pads. Let dry.  Apply patch as indicated in monitor instructions. Patch will be placed under collarbone on left  side of chest with arrow pointing upward.  Rub patch adhesive wings  for 2 minutes. Remove white label marked 1. Remove the white  label marked 2. Rub patch adhesive wings for 2 additional minutes.  While looking in a mirror, press and release button in center of patch. A small green light will  flash 3-4 times. This will be your only indicator that the monitor has been turned on.  Do not shower for the first 24 hours. You may shower after the first 24 hours.  Press the button if you feel a symptom. You will hear a small click. Record Date, Time and  Symptom in the Patient Logbook.  When you are ready to remove the patch, follow instructions on the last 2 pages of Patient  Logbook. Stick patch monitor onto the last page of Patient Logbook.  Place Patient Logbook in the blue and white box. Use locking tab on box and tape box closed  securely. The blue and white box has prepaid postage on it. Please place it in the mailbox as  soon as possible. Your physician should have your test results approximately 7 days after the  monitor has been mailed back to [facility].  Call [facility] Customer Care at [phone] if you have questions regarding  your ZIO XT patch monitor. Call them immediately if you see an orange light blinking on your  monitor.  If your monitor falls off in less than 4 days, contact our Monitor department at [phone].  If your monitor becomes loose or falls off after 4 days call [employer] at [phone] for  suggestions on securing your monitor  Follow-Up: At [facility], you and your health needs are our priority.  As part of our continuing mission to provide you with exceptional heart care, our providers are all part of one team.  This team includes your primary Cardiologist (physician) and Advanced Practice Providers or APPs (Physician Assistants and Nurse Practitioners) who all work together to provide you with the care you need, when you need it.  Your next appointment:   6 month(s)  Provider:   Jerel Balding,  MD    We recommend signing up for the patient portal called MyChart.  Sign up information is provided on this After Visit Summary.  MyChart is used to connect with patients for Virtual Visits (Telemedicine).  Patients are able to view lab/test results, encounter notes, upcoming appointments, etc.  Non-urgent messages can be sent to your provider as well.   To learn more about what you can do with MyChart, go to [email or web address].   Other Instructions

## 2024-06-02 NOTE — Progress Notes (Unsigned)
 Enrolled patient for a 14 day Zio XT monitor to be mailed to patients home.   Croitoru to read

## 2024-06-02 NOTE — Assessment & Plan Note (Addendum)
 EF 60-65 by echocardiogram December 2021.  She has continued dyspnea with exertion.  She describes NYHA IIb-III symptoms. I suspect her dyspnea is multifactorial including CHF, reactive airways disease, obesity, deconditioning. She does not appear to be volume overloaded on exam.  She has seen pulmonology for evaluation and diagnosed with possible asthma.  She has noted some improvement with inhaled medications.  Given her chronic kidney disease, I would not significantly increase her diuretics on that she is obviously volume overloaded.  She does have several risk factors for cardiac amyloid including bilateral carpal tunnel syndrome, spinal stenosis and LVH. -Continue Lasix  40 mg daily, Jardiance  10 mg daily -Obtain follow-up echocardiogram -Arrange PYP scan, SPEP/UPEP to rule out cardiac amyloid - Follow-up 6 months

## 2024-06-04 DIAGNOSIS — G5603 Carpal tunnel syndrome, bilateral upper limbs: Secondary | ICD-10-CM | POA: Diagnosis not present

## 2024-06-04 DIAGNOSIS — I517 Cardiomegaly: Secondary | ICD-10-CM | POA: Diagnosis not present

## 2024-06-04 DIAGNOSIS — I5032 Chronic diastolic (congestive) heart failure: Secondary | ICD-10-CM | POA: Diagnosis not present

## 2024-06-04 LAB — UPEP/UIFE/LIGHT CHAINS/TP, 24-HR UR

## 2024-06-04 LAB — MULTIPLE MYELOMA PANEL, SERUM

## 2024-06-07 LAB — UPEP/UIFE/LIGHT CHAINS/TP, 24-HR UR
% BETA, Urine: 0
ALBUMIN, U: 100
ALPHA-2-GLOBULIN, U: 0
Free Lambda Lt Chains,Ur: 2.42 mg/L (ref 0.27–15.21)
GAMMA GLOBULIN URINE: 0
NOTE:: 24.4 mg/L (ref 1.17–86.46)
Protein, Ur: 5.2 mg/dL

## 2024-06-07 LAB — MULTIPLE MYELOMA PANEL, SERUM
Albumin SerPl Elph-Mcnc: 3.7 g/dL (ref 2.9–4.4)
Alpha 1: 0.2 g/dL (ref 0.0–0.4)
Alpha2 Glob SerPl Elph-Mcnc: 0.7 g/dL (ref 0.4–1.0)
B-Globulin SerPl Elph-Mcnc: 1 g/dL (ref 0.7–1.3)
Gamma Glob SerPl Elph-Mcnc: 0.8 g/dL (ref 0.4–1.8)
Globulin, Total: 2.8 g/dL (ref 2.2–3.9)
IgA/Immunoglobulin A, Serum: 156 mg/dL (ref 64–422)
IgG (Immunoglobin G), Serum: 953 mg/dL (ref 586–1602)
IgM (Immunoglobulin M), Srm: 78 mg/dL (ref 26–217)

## 2024-06-10 ENCOUNTER — Other Ambulatory Visit: Payer: Self-pay | Admitting: Cardiovascular Disease

## 2024-06-12 ENCOUNTER — Ambulatory Visit: Payer: Self-pay | Admitting: Physician Assistant

## 2024-06-12 DIAGNOSIS — R803 Bence Jones proteinuria: Secondary | ICD-10-CM

## 2024-06-12 DIAGNOSIS — I5032 Chronic diastolic (congestive) heart failure: Secondary | ICD-10-CM

## 2024-06-12 DIAGNOSIS — I350 Nonrheumatic aortic (valve) stenosis: Secondary | ICD-10-CM

## 2024-06-24 ENCOUNTER — Other Ambulatory Visit: Payer: Self-pay | Admitting: Internal Medicine

## 2024-06-25 DIAGNOSIS — R002 Palpitations: Secondary | ICD-10-CM | POA: Diagnosis not present

## 2024-06-26 DIAGNOSIS — R002 Palpitations: Secondary | ICD-10-CM

## 2024-07-06 ENCOUNTER — Ambulatory Visit: Admitting: Pulmonary Disease

## 2024-07-06 ENCOUNTER — Encounter: Payer: Self-pay | Admitting: Pulmonary Disease

## 2024-07-06 VITALS — BP 130/84 | HR 70 | Ht 63.0 in | Wt 221.6 lb

## 2024-07-06 DIAGNOSIS — R0600 Dyspnea, unspecified: Secondary | ICD-10-CM

## 2024-07-06 DIAGNOSIS — J4530 Mild persistent asthma, uncomplicated: Secondary | ICD-10-CM | POA: Diagnosis not present

## 2024-07-06 NOTE — Patient Instructions (Signed)
 VISIT SUMMARY:  Today, you were seen for chronic shortness of breath and wheezing. We also discussed your ongoing evaluation for amyloidosis and reviewed your pulmonary imaging and function tests. Your chronic kidney disease was also considered in the context of your symptoms.  YOUR PLAN:  -SHORTNESS OF BREATH: Your chronic shortness of breath, which worsens with physical activity, may be related to amyloidosis, a condition where abnormal proteins build up in your organs. We will continue your current treatment with the Advair inhaler and await the results of your upcoming cardiac and hematologic evaluations.  -WHEEZING: Your wheezing has improved with the use of the Advair inhaler. Since it is effective, you should continue using it as prescribed.  -CHRONIC KIDNEY DISEASE: Your chronic kidney disease may be linked to amyloidosis, as suggested by the presence of Bence Jones protein in your urine. We will wait for further evaluation and results from your hematologist consultation and additional tests.  INSTRUCTIONS:  Please continue using your Advair inhaler as prescribed. Await the results of your echocardiogram and nuclear perfusion study scheduled for July 20, 2024, and attend your hematology consultation on July 14, 2024. Follow up with your cardiologist and hematologist as advised.

## 2024-07-06 NOTE — Progress Notes (Signed)
 Katherine Rush    [id number]    Sep 17, 1946  Primary Care Physician:Aronson, Charlie, MD  Referring Physician: Shepard Charlie, MD [address] [place],  [place] [address]  Chief complaint: Follow up for dyspnea, mild asthma  HPI: 78 y.o. who  has a past medical history of Allergic rhinitis, Gastroesophageal reflux disease, Gout, Helicobacter pylori gastritis, History of colon polyps, Hyperlipidemia, Hypertension, IBS (irritable bowel syndrome), Internal hemorrhoids, Lexiscan  Myoview  09/2021, Non-alcoholic fatty liver disease, Spinal stenosis of lumbar region, and Tubular adenoma of colon.   Discussed the use of AI scribe software for clinical note transcription with the patient, who gave verbal consent to proceed.  The patient, with a history of mild congestive heart failure with preserved ejection fraction, presents with persistent lower extremity edema and shortness of breath. She initially attributed these symptoms to the stress of caring for her late mother, but the symptoms persisted after her passing. Cardiology workup revealed one wall of a cardiac chamber with reduced elasticity. Despite treatment with Lasix  20mg  daily and Jardiance , the patient continues to experience 'fat feet' and shortness of breath.  The patient also reports a nonproductive cough and occasional wheezing, more noticeable at night. She sleeps with her head elevated due to a combination of back issues from degenerative disc disease and a belief that it improves her breathing. She denies smoking and pet exposure but mentions a long-discarded feather pillow.  The patient's physical activity is limited due to breathlessness and knee issues. She reports significant fatigue after showering and washing her hair. She expresses a desire to exercise more but struggles with mobility due to her knees.  The patient's mother had unspecified interstitial lung disease that look like chronic hypersensitivity  pneumonitis which is treated conservatively without a biopsy.  She is concerned about a hereditary component. However, the patient is unsure if any other family members have lung disease, though she notes a family history of cardiac disease.   Interim history: History of Present Illness Katherine Rush is a 78 year old female who presents with chronic shortness of breath for evaluation of her respiratory symptoms.  Dyspnea and wheezing - Chronic shortness of breath with minimal exertion; unable to perform daily activities without dyspnea - Advair inhaler provides relief for wheezing but does not improve shortness of breath - Unable to use Symbicort  due to insurance issues; currently using Advair  Evaluation for amyloidosis - Undergoing workup for amyloidosis - Completed 24-hour urine test and blood work: small amount of Bence Jones protein detected in urine, not in blood - Scheduled for repeat echocardiogram and nuclear perfusion test on July 20, 2024 - Hematology consultation scheduled for July 14, 2024  Pulmonary imaging and function - Previous CT scan and lung function tests reviewed, showing mild changes  Chronic kidney disease - Chronic kidney disease present   Relevant Pulmonary history: Pets: Occupation: Retired Exposures: No known exposures Smoking history: Never smoker Travel history: No significant travel Relevant family history: Mom   Outpatient Encounter Medications as of 07/06/2024  Medication Sig   acetaminophen (TYLENOL) 500 MG tablet Take 500 mg by mouth every 6 (six) hours as needed.   allopurinol (ZYLOPRIM) 100 MG tablet Take 100 mg by mouth daily.   carvedilol  (COREG ) 6.25 MG tablet Take 1 tablet (6.25 mg total) by mouth 2 (two) times daily. (Patient taking differently: Take 6.25 mg by mouth 2 (two) times daily. Taking 2 times, once in the morning once at night)  colchicine 0.6 MG tablet Take 0.3 mg by mouth as needed.   Cyanocobalamin  (VITAMIN B 12) 500  MCG TABS Take 1,000 mcg by mouth daily.   empagliflozin  (JARDIANCE ) 10 MG TABS tablet TAKE 1 TABLET BY MOUTH DAILY BEFORE BREAKFAST.   famotidine  (PEPCID ) 20 MG tablet Take 1 tablet (20 mg total) by mouth at bedtime as needed for heartburn or indigestion.   fluticasone -salmeterol (ADVAIR) 500-50 MCG/ACT AEPB Inhale 1 puff into the lungs in the morning and at bedtime.   furosemide  (LASIX ) 40 MG tablet TAKE 1 TABLET BY MOUTH DAILY (Patient taking differently: Take 40 mg by mouth daily. Taking half a tablet everyday)   Metamucil Fiber CHEW Metamucil gummys   olmesartan  (BENICAR ) 40 MG tablet Take 40 mg by mouth daily. Taking 20 mg in the a.m. and 20 mg at night (Patient taking differently: Take 20 mg by mouth in the morning and at bedtime. Taking 20 mg in the a.m. and 20 mg at night)   omeprazole  (PRILOSEC) 40 MG capsule TAKE 1 CAPSULE BY MOUTH DAILY   ondansetron  (ZOFRAN ) 4 MG tablet Take 1 tablet (4 mg total) by mouth every 8 (eight) hours as needed for nausea or vomiting.   pravastatin  (PRAVACHOL ) 20 MG tablet Take 1 tablet (20 mg total) by mouth at bedtime.   promethazine  (PHENERGAN ) 25 MG tablet Take 0.5 tablets (12.5 mg total) by mouth every 6 (six) hours as needed for nausea or vomiting.   Vitamin E 400 units TABS Take 2 tablets by mouth in the morning.   No facility-administered encounter medications on file as of 07/06/2024.   Physical Exam: Blood pressure 130/84, pulse 70, height 5' 3 (1.6 m), weight 221 lb 9.6 oz (100.5 kg), SpO2 94%. Gen:      No acute distress HEENT:  EOMI, sclera anicteric Neck:     No masses; no thyromegaly Lungs:    Clear to auscultation bilaterally; normal respiratory effort CV:         Regular rate and rhythm; no murmurs Abd:      + bowel sounds; soft, non-tender; no palpable masses, no distension Ext:    No edema; adequate peripheral perfusion Neuro: alert and oriented x 3 Psych: normal mood and affect   Data Reviewed: Imaging: CT high-resolution  10/01/2023-metastatic pulmonary continuation with air trapping, also notable bronchiectasis.  No evidence of interstitial lung disease.  Cirrhosis. I have reviewed the images personally.  PFTs: 12/20/2023 FVC 1.86 [68%], FEV1 1.62 [79%], F/F87, TLC 3.58 [70%], DLCO 13.10 [69%] Mild restriction, diffusion defect  Labs: CBC 02/18/2024-WBC 7.6, eos 4.9%, absolute eosinophil count 372.4 Assessment & Plan Shortness of breath Chronic shortness of breath exacerbated by physical activity. Differential diagnosis includes cardiac amyloidosis and hematologic amyloidosis. Cardiologist has initiated workup for amyloidosis. CT scan and lung function test show no significant lung issues, with only mild changes noted. - Await results of cardiac and hematologic evaluations, including echocardiogram, nuclear perfusion study, and hematologist consultation.  Mild asthma, wheezing CT scan shows changes consistent with asthma, including air trapping.  She has mild peripheral eosinophilia.  Pulmonary function tests show mild to moderate reduction in lung capacity, possibly due to age but there is no evidence of interstitial lung disease on CT scan  Wheezing improved with Advair inhaler. Insurance issues prevented trial of Symbicort , but Advair is providing relief. - Continue Advair inhaler as it is effective in managing wheezing.  Chronic kidney disease Potential link to amyloidosis being investigated by cardiologist. Presence of Bence Jones protein in urine  suggests possible hematologic origin. - Await further evaluation and results from hematologist consultation and additional tests ordered by cardiologist.   Follow-up in 6 months.   Recommendations: Continue Advair Follow-up in 6 months  Lonna Coder MD [facility] 07/06/2024, 10:07 AM  CC: Shepard Ade, MD

## 2024-07-09 DIAGNOSIS — N184 Chronic kidney disease, stage 4 (severe): Secondary | ICD-10-CM | POA: Diagnosis not present

## 2024-07-09 DIAGNOSIS — E785 Hyperlipidemia, unspecified: Secondary | ICD-10-CM | POA: Diagnosis not present

## 2024-07-09 DIAGNOSIS — R0602 Shortness of breath: Secondary | ICD-10-CM | POA: Diagnosis not present

## 2024-07-09 DIAGNOSIS — I5030 Unspecified diastolic (congestive) heart failure: Secondary | ICD-10-CM | POA: Diagnosis not present

## 2024-07-09 DIAGNOSIS — K760 Fatty (change of) liver, not elsewhere classified: Secondary | ICD-10-CM | POA: Diagnosis not present

## 2024-07-09 DIAGNOSIS — I129 Hypertensive chronic kidney disease with stage 1 through stage 4 chronic kidney disease, or unspecified chronic kidney disease: Secondary | ICD-10-CM | POA: Diagnosis not present

## 2024-07-10 ENCOUNTER — Telehealth (HOSPITAL_COMMUNITY): Payer: Self-pay | Admitting: *Deleted

## 2024-07-10 NOTE — Telephone Encounter (Signed)
 Reminder call given for upcoming amyloid study on 07/20/24 at 1:00

## 2024-07-13 ENCOUNTER — Other Ambulatory Visit: Payer: Self-pay | Admitting: Physician Assistant

## 2024-07-14 ENCOUNTER — Telehealth: Payer: Self-pay

## 2024-07-14 ENCOUNTER — Encounter: Payer: Self-pay | Admitting: Hematology and Oncology

## 2024-07-14 ENCOUNTER — Inpatient Hospital Stay: Attending: Hematology and Oncology | Admitting: Hematology and Oncology

## 2024-07-14 ENCOUNTER — Inpatient Hospital Stay

## 2024-07-14 VITALS — BP 115/56 | HR 75 | Temp 98.7°F | Resp 18 | Ht 63.0 in | Wt 221.8 lb

## 2024-07-14 DIAGNOSIS — R803 Bence Jones proteinuria: Secondary | ICD-10-CM

## 2024-07-14 DIAGNOSIS — I5032 Chronic diastolic (congestive) heart failure: Secondary | ICD-10-CM | POA: Insufficient documentation

## 2024-07-14 DIAGNOSIS — N1831 Chronic kidney disease, stage 3a: Secondary | ICD-10-CM | POA: Diagnosis not present

## 2024-07-14 DIAGNOSIS — Z803 Family history of malignant neoplasm of breast: Secondary | ICD-10-CM | POA: Insufficient documentation

## 2024-07-14 DIAGNOSIS — E559 Vitamin D deficiency, unspecified: Secondary | ICD-10-CM | POA: Diagnosis not present

## 2024-07-14 LAB — BRAIN NATRIURETIC PEPTIDE: B Natriuretic Peptide: 11.2 pg/mL (ref 0.0–100.0)

## 2024-07-14 LAB — CMP (CANCER CENTER ONLY)
ALT: 19 U/L (ref 0–44)
AST: 25 U/L (ref 15–41)
Albumin: 4.1 g/dL (ref 3.5–5.0)
Alkaline Phosphatase: 88 U/L (ref 38–126)
Anion gap: 13 (ref 5–15)
BUN: 60 mg/dL — ABNORMAL HIGH (ref 8–23)
CO2: 25 mmol/L (ref 22–32)
Calcium: 9.2 mg/dL (ref 8.9–10.3)
Chloride: 101 mmol/L (ref 98–111)
Creatinine: 2.26 mg/dL — ABNORMAL HIGH (ref 0.44–1.00)
GFR, Estimated: 22 mL/min — ABNORMAL LOW (ref >60)
Glucose, Bld: 77 mg/dL (ref 70–99)
Potassium: 4.4 mmol/L (ref 3.5–5.1)
Sodium: 139 mmol/L (ref 135–145)
Total Bilirubin: 0.7 mg/dL (ref 0.0–1.2)
Total Protein: 7.2 g/dL (ref 6.5–8.1)

## 2024-07-14 LAB — CBC WITH DIFFERENTIAL (CANCER CENTER ONLY)
Abs Immature Granulocytes: 0.02 K/uL (ref 0.00–0.07)
Basophils Absolute: 0.1 K/uL (ref 0.0–0.1)
Basophils Relative: 1 %
Eosinophils Absolute: 0.3 K/uL (ref 0.0–0.5)
Eosinophils Relative: 4 %
HCT: 41.3 % (ref 36.0–46.0)
Hemoglobin: 13.5 g/dL (ref 12.0–15.0)
Immature Granulocytes: 0 %
Lymphocytes Relative: 26 %
Lymphs Abs: 1.9 K/uL (ref 0.7–4.0)
MCH: 30.8 pg (ref 26.0–34.0)
MCHC: 32.7 g/dL (ref 30.0–36.0)
MCV: 94.3 fL (ref 80.0–100.0)
Monocytes Absolute: 0.8 K/uL (ref 0.1–1.0)
Monocytes Relative: 11 %
Neutro Abs: 4.5 K/uL (ref 1.7–7.7)
Neutrophils Relative %: 58 %
Platelet Count: 163 K/uL (ref 150–400)
RBC: 4.38 MIL/uL (ref 3.87–5.11)
RDW: 14.3 % (ref 11.5–15.5)
WBC Count: 7.6 K/uL (ref 4.0–10.5)
nRBC: 0 % (ref 0.0–0.2)

## 2024-07-14 LAB — LACTATE DEHYDROGENASE: LDH: 174 U/L (ref 98–192)

## 2024-07-14 LAB — VITAMIN D 25 HYDROXY (VIT D DEFICIENCY, FRACTURES): Vit D, 25-Hydroxy: 24.03 ng/mL — ABNORMAL LOW (ref 30–100)

## 2024-07-14 LAB — TROPONIN I (HIGH SENSITIVITY): Troponin I (High Sensitivity): 3 ng/L (ref <18)

## 2024-07-14 LAB — SEDIMENTATION RATE: Sed Rate: 16 mm/h (ref 0–22)

## 2024-07-14 MED ORDER — FUROSEMIDE 40 MG PO TABS: 40.0000 mg | ORAL_TABLET | Freq: Every day | ORAL | Status: DC

## 2024-07-14 NOTE — Progress Notes (Signed)
 [facility] CONSULT NOTE  Patient Care Team: Shepard Ade, MD as PCP - General (Internal Medicine) Croitoru, Jerel, MD as PCP - Cardiology (Cardiology) Lelon Glendia ONEIDA DEVONNA as Physician Assistant (Cardiology) Croitoru, Jerel, MD as Consulting Physician (Cardiology)  ASSESSMENT & PLAN:  Bence-Jones proteinuria She had test ordered by cardiologist due to concern that she might have amyloidosis Further cardiac imaging is pending Her recent serum protein electrophoresis did not reveal any abnormal M spike Her 24-hour urine collection revealed Bence-Jones protein but without significant signs of proteinuria  We discussed further investigations for paraproteinemia including blood work and imaging studies I will see her back after test results are available  Stage 3a chronic kidney disease (HCC) This may or may not be related to paraproteinemia Will order additional workup  (HFpEF) heart failure with preserved ejection fraction (HCC) Further workup in progress with additional imaging studies by cardiologist I will order cardiac troponin and BNP for evaluation   Orders Placed This Encounter  Procedures   DG Bone Survey Met    Standing Status:   Future    Expected Date:   07/21/2024    Expiration Date:   07/14/2025    Reason for Exam (SYMPTOM  OR DIAGNOSIS REQUIRED):   staging myeloma    Preferred imaging location?:   [facility]   Beta 2 microglobulin, serum    Standing Status:   Future    Number of Occurrences:   1    Expiration Date:   07/14/2025   Kappa/lambda light chains    Standing Status:   Future    Number of Occurrences:   1    Expiration Date:   07/14/2025   Multiple Myeloma Panel (SPEP&IFE w/QIG)    Standing Status:   Future    Number of Occurrences:   1    Expiration Date:   07/14/2025   VITAMIN D  25 Hydroxy (Vit-D Deficiency, Fractures)    Standing Status:   Future    Number of Occurrences:   1    Expiration Date:   07/14/2025   CBC with  Differential (Cancer Center Only)    Standing Status:   Future    Number of Occurrences:   1    Expiration Date:   07/14/2025   CMP (Cancer Center only)    Standing Status:   Future    Number of Occurrences:   1    Expiration Date:   07/14/2025   Sedimentation rate    Standing Status:   Future    Number of Occurrences:   1    Expiration Date:   07/14/2025   Lactate dehydrogenase    Standing Status:   Future    Number of Occurrences:   1    Expiration Date:   07/14/2025   BNP (Brain natriuretic peptide)    Standing Status:   Future    Number of Occurrences:   1    Expiration Date:   07/14/2025    All questions were answered. The patient knows to call the clinic with any problems, questions or concerns. I spent 60 minutes counseling the patient face to face. The total time spent in the appointment was 60 minutes and more than 50% was on counseling.     Almarie Bedford, MD 07/14/24 1:27 PM  CHIEF COMPLAINTS/PURPOSE OF CONSULTATION:  Abnormal urine protein electrophoresis, Bence-Jones proteinemia  HISTORY OF PRESENTING ILLNESS:  Katherine Rush 78 y.o. female is here because of abnormal urine protein electrophoresis Patient has  interstitial lung disease and heart failure with preserved ejection fraction She has chronic shortness of breath on minimal exertion At baseline, she has limited physical activity, due to significant joint pain Her cardiology team order additional tests and referral her here for further investigation with concern for amyloidosis She denies history of abnormal bone pain or bone fracture. Patient denies history of recurrent infection or atypical infections such as shingles of meningitis. Denies chills, night sweats, anorexia or abnormal weight loss.  MEDICAL HISTORY:  Past Medical History:  Diagnosis Date   Allergic rhinitis    Gastroesophageal reflux disease    Gout    Helicobacter pylori gastritis    History of colon polyps    Hyperlipidemia    Hypertension     IBS (irritable bowel syndrome)    Internal hemorrhoids    Lexiscan  Myoview  09/2021    Myoview  10/22: no ischemia, EF 86, low risk   Non-alcoholic fatty liver disease    Spinal stenosis of lumbar region    Tubular adenoma of colon     SURGICAL HISTORY: Past Surgical History:  Procedure Laterality Date   APPENDECTOMY  1960   BREAST BIOPSY  1985   right   CATARACT EXTRACTION  07/2011   TOTAL ABDOMINAL HYSTERECTOMY W/ BILATERAL SALPINGOOPHORECTOMY  1990    SOCIAL HISTORY: Social History   Socioeconomic History   Marital status: Single    Spouse name: Not on file   Number of children: Not on file   Years of education: Not on file   Highest education level: Not on file  Occupational History   Occupation: retired Charity fundraiser  Tobacco Use   Smoking status: Never   Smokeless tobacco: Never  Vaping Use   Vaping status: Never Used  Substance and Sexual Activity   Alcohol  use: No    Alcohol /week: 0.0 standard drinks of alcohol    Drug use: No   Sexual activity: Not on file  Other Topics Concern   Not on file  Social History Narrative   Not on file   Social Drivers of Health   Financial Resource Strain: Not on file  Food Insecurity: No Food Insecurity (07/14/2024)   Hunger Vital Sign    Worried About Running Out of Food in the Last Year: Never true    Ran Out of Food in the Last Year: Never true  Transportation Needs: No Transportation Needs (07/14/2024)   PRAPARE - Administrator, Civil Service (Medical): No    Lack of Transportation (Non-Medical): No  Physical Activity: Not on file  Stress: Not on file  Social Connections: Not on file  Intimate Partner Violence: Not At Risk (07/14/2024)   Humiliation, Afraid, Rape, and Kick questionnaire    Fear of Current or Ex-Partner: No    Emotionally Abused: No    Physically Abused: No    Sexually Abused: No    FAMILY HISTORY: Family History  Problem Relation Age of Onset   Diabetes Mother    Stroke Mother    Heart  attack Mother    Heart attack Father    Cancer Maternal Aunt        ? ovarian or cervical   Breast cancer Maternal Aunt    Throat cancer Cousin        1st cousin   Breast cancer Cousin        1st cousin   Stroke Maternal Grandmother    Heart attack Maternal Grandfather    Other Other  Majority of mother's side with heart disease   Colon cancer Neg Hx     ALLERGIES:  is allergic to fish allergy, shellfish-derived products, iodine, ivp dye [iodinated contrast media], other, penicillins, and sulfonamide derivatives.  MEDICATIONS:  Current Outpatient Medications  Medication Sig Dispense Refill   acetaminophen (TYLENOL) 500 MG tablet Take 500 mg by mouth every 6 (six) hours as needed.     allopurinol (ZYLOPRIM) 100 MG tablet Take 100 mg by mouth daily.     carvedilol  (COREG ) 6.25 MG tablet Take 1 tablet (6.25 mg total) by mouth 2 (two) times daily. (Patient taking differently: Take 6.25 mg by mouth 2 (two) times daily. Taking 2 times, once in the morning once at night) 180 tablet 3   colchicine 0.6 MG tablet Take 0.3 mg by mouth as needed.     Cyanocobalamin  (VITAMIN B 12) 500 MCG TABS Take 1,000 mcg by mouth daily.     empagliflozin  (JARDIANCE ) 10 MG TABS tablet TAKE 1 TABLET BY MOUTH DAILY BEFORE BREAKFAST. 90 tablet 3   famotidine  (PEPCID ) 20 MG tablet Take 1 tablet (20 mg total) by mouth at bedtime as needed for heartburn or indigestion.     fluticasone -salmeterol (ADVAIR) 500-50 MCG/ACT AEPB Inhale 1 puff into the lungs in the morning and at bedtime. 60 each 5   furosemide  (LASIX ) 40 MG tablet Take 1 tablet (40 mg total) by mouth daily. Taking half a tablet everyday     Metamucil Fiber CHEW Metamucil gummys     olmesartan  (BENICAR ) 40 MG tablet Take 40 mg by mouth daily. Taking 20 mg in the a.m. and 20 mg at night (Patient taking differently: Take 20 mg by mouth in the morning and at bedtime. Taking 20 mg in the a.m. and 20 mg at night)     omeprazole  (PRILOSEC) 40 MG capsule  TAKE 1 CAPSULE BY MOUTH DAILY 90 capsule 1   ondansetron  (ZOFRAN ) 4 MG tablet Take 1 tablet (4 mg total) by mouth every 8 (eight) hours as needed for nausea or vomiting. 20 tablet 1   pravastatin  (PRAVACHOL ) 20 MG tablet Take 1 tablet (20 mg total) by mouth at bedtime. 90 tablet 3   promethazine  (PHENERGAN ) 25 MG tablet Take 0.5 tablets (12.5 mg total) by mouth every 6 (six) hours as needed for nausea or vomiting. 20 tablet 1   Vitamin E 400 units TABS Take 2 tablets by mouth in the morning.     No current facility-administered medications for this visit.    REVIEW OF SYSTEMS: She has chronic shortness of breath without cough.  She has mild chronic bilateral lower extremity edema Eyes: Denies blurriness of vision, double vision or watery eyes Ears, nose, mouth, throat, and face: Denies mucositis or sore throat Gastrointestinal:  Denies nausea, heartburn or change in bowel habits Skin: Denies abnormal skin rashes Lymphatics: Denies new lymphadenopathy or easy bruising Neurological:Denies numbness, tingling or new weaknesses Behavioral/Psych: Mood is stable, no new changes  All other systems were reviewed with the patient and are negative.  PHYSICAL EXAMINATION: ECOG PERFORMANCE STATUS: 1 - Symptomatic but completely ambulatory  Vitals:   07/14/24 1043  BP: (!) 115/56  Pulse: 75  Resp: 18  Temp: 98.7 F (37.1 C)  SpO2: 97%   Filed Weights   07/14/24 1043  Weight: 221 lb 12.8 oz (100.6 kg)    GENERAL:alert, no distress and comfortable SKIN: skin color, texture, turgor are normal, no rashes or significant lesions EYES: normal, conjunctiva are pink and non-injected, sclera clear  OROPHARYNX:no exudate, no erythema and lips, buccal mucosa, and tongue normal  NECK: supple, thyroid  normal size, non-tender, without nodularity LYMPH:  no palpable lymphadenopathy in the cervical, axillary or inguinal LUNGS: clear to auscultation and percussion with normal breathing effort HEART:  regular rate & rhythm and no murmurs with minimal bilateral lower extremity edema ABDOMEN:abdomen soft, non-tender and normal bowel sounds Musculoskeletal:no cyanosis of digits and no clubbing  PSYCH: alert & oriented x 3 with fluent speech NEURO: no focal motor/sensory deficits  LABORATORY DATA:  I have reviewed the data as listed Lab Results  Component Value Date   WBC 7.6 07/14/2024   HGB 13.5 07/14/2024   HCT 41.3 07/14/2024   MCV 94.3 07/14/2024   PLT 163 07/14/2024

## 2024-07-14 NOTE — Telephone Encounter (Signed)
 Attempted to call pt to let her know we were able to get skeletal survey scheduled for 8/16 at 1000. LVM for call back to confirm this date/time is OK and to give instructions.

## 2024-07-14 NOTE — Assessment & Plan Note (Signed)
 Further workup in progress with additional imaging studies by cardiologist I will order cardiac troponin and BNP for evaluation

## 2024-07-14 NOTE — Assessment & Plan Note (Signed)
 This may or may not be related to paraproteinemia Will order additional workup

## 2024-07-14 NOTE — Assessment & Plan Note (Signed)
 She had test ordered by cardiologist due to concern that she might have amyloidosis Further cardiac imaging is pending Her recent serum protein electrophoresis did not reveal any abnormal M spike Her 24-hour urine collection revealed Bence-Jones protein but without significant signs of proteinuria  We discussed further investigations for paraproteinemia including blood work and imaging studies I will see her back after test results are available

## 2024-07-15 LAB — KAPPA/LAMBDA LIGHT CHAINS
Kappa free light chain: 52.1 mg/L — ABNORMAL HIGH (ref 3.3–19.4)
Kappa, lambda light chain ratio: 1.35 (ref 0.26–1.65)
Lambda free light chains: 38.5 mg/L — ABNORMAL HIGH (ref 5.7–26.3)

## 2024-07-15 LAB — BETA 2 MICROGLOBULIN, SERUM: Beta-2 Microglobulin: 5.4 mg/L — ABNORMAL HIGH (ref 0.6–2.4)

## 2024-07-16 ENCOUNTER — Telehealth: Payer: Self-pay

## 2024-07-16 NOTE — Telephone Encounter (Signed)
 Patient called regarding upcoming bone survey appointment. Patient confirmed appointment date, time, and location. Requested that patient try to arrive 15-minutes early for check-in. Patient verbalized an understanding of the information and voiced appreciation for the call.

## 2024-07-17 ENCOUNTER — Other Ambulatory Visit: Payer: Self-pay | Admitting: Physician Assistant

## 2024-07-17 DIAGNOSIS — G5603 Carpal tunnel syndrome, bilateral upper limbs: Secondary | ICD-10-CM

## 2024-07-17 DIAGNOSIS — I517 Cardiomegaly: Secondary | ICD-10-CM

## 2024-07-17 DIAGNOSIS — I5032 Chronic diastolic (congestive) heart failure: Secondary | ICD-10-CM

## 2024-07-17 LAB — MULTIPLE MYELOMA PANEL, SERUM
Albumin SerPl Elph-Mcnc: 3.5 g/dL (ref 2.9–4.4)
Albumin/Glob SerPl: 1.2 (ref 0.7–1.7)
Alpha 1: 0.2 g/dL (ref 0.0–0.4)
Alpha2 Glob SerPl Elph-Mcnc: 0.8 g/dL (ref 0.4–1.0)
B-Globulin SerPl Elph-Mcnc: 1.1 g/dL (ref 0.7–1.3)
Gamma Glob SerPl Elph-Mcnc: 0.9 g/dL (ref 0.4–1.8)
Globulin, Total: 3.1 g/dL (ref 2.2–3.9)
IgA: 152 mg/dL (ref 64–422)
IgG (Immunoglobin G), Serum: 964 mg/dL (ref 586–1602)
IgM (Immunoglobulin M), Srm: 74 mg/dL (ref 26–217)
Total Protein ELP: 6.6 g/dL (ref 6.0–8.5)

## 2024-07-20 ENCOUNTER — Ambulatory Visit (HOSPITAL_COMMUNITY)
Admission: RE | Admit: 2024-07-20 | Discharge: 2024-07-20 | Disposition: A | Discharge status: Home / Self Care | Source: Ambulatory Visit | Attending: Physician Assistant | Admitting: Physician Assistant

## 2024-07-20 ENCOUNTER — Ambulatory Visit (HOSPITAL_COMMUNITY)
Admission: RE | Admit: 2024-07-20 | Discharge: 2024-07-20 | Disposition: A | Discharge status: Home / Self Care | Source: Ambulatory Visit | Attending: Cardiovascular Disease | Admitting: Cardiovascular Disease

## 2024-07-20 DIAGNOSIS — I5032 Chronic diastolic (congestive) heart failure: Secondary | ICD-10-CM

## 2024-07-20 DIAGNOSIS — I517 Cardiomegaly: Secondary | ICD-10-CM

## 2024-07-20 DIAGNOSIS — G5603 Carpal tunnel syndrome, bilateral upper limbs: Secondary | ICD-10-CM

## 2024-07-20 DIAGNOSIS — R002 Palpitations: Secondary | ICD-10-CM | POA: Insufficient documentation

## 2024-07-20 LAB — ECHOCARDIOGRAM COMPLETE
AR max vel: 1.93 cm2
AV Area VTI: 2.15 cm2
AV Area mean vel: 1.99 cm2
AV Mean grad: 11 mmHg
AV Peak grad: 21.3 mmHg
Ao pk vel: 2.31 m/s
Area-P 1/2: 2.54 cm2
S' Lateral: 2.5 cm

## 2024-07-20 LAB — MYOCARDIAL AMYLOID PLANAR & SPECT: H/CL Ratio: 0.99

## 2024-07-20 MED ORDER — TECHNETIUM TC 99M PYROPHOSPHATE
22.0000 | Freq: Once | INTRAVENOUS | Status: AC
Start: 2024-07-20 — End: 2024-07-20
  Administered 2024-07-20 (×2): 22 via INTRAVENOUS

## 2024-07-21 ENCOUNTER — Encounter: Payer: Self-pay | Admitting: Physician Assistant

## 2024-07-21 ENCOUNTER — Ambulatory Visit (HOSPITAL_COMMUNITY)
Admission: RE | Admit: 2024-07-21 | Discharge: 2024-07-21 | Disposition: A | Discharge status: Home / Self Care | Source: Ambulatory Visit | Attending: Hematology and Oncology | Admitting: Hematology and Oncology

## 2024-07-21 DIAGNOSIS — R803 Bence Jones proteinuria: Secondary | ICD-10-CM | POA: Insufficient documentation

## 2024-07-21 DIAGNOSIS — C9000 Multiple myeloma not having achieved remission: Secondary | ICD-10-CM | POA: Diagnosis not present

## 2024-07-21 DIAGNOSIS — N1831 Chronic kidney disease, stage 3a: Secondary | ICD-10-CM | POA: Diagnosis not present

## 2024-07-21 DIAGNOSIS — I350 Nonrheumatic aortic (valve) stenosis: Secondary | ICD-10-CM | POA: Insufficient documentation

## 2024-07-24 ENCOUNTER — Inpatient Hospital Stay (HOSPITAL_BASED_OUTPATIENT_CLINIC_OR_DEPARTMENT_OTHER): Admitting: Hematology and Oncology

## 2024-07-24 ENCOUNTER — Encounter: Payer: Self-pay | Admitting: Hematology and Oncology

## 2024-07-24 VITALS — BP 120/61 | HR 73 | Temp 98.3°F | Resp 18 | Wt 220.6 lb

## 2024-07-24 DIAGNOSIS — R803 Bence Jones proteinuria: Secondary | ICD-10-CM | POA: Diagnosis not present

## 2024-07-24 DIAGNOSIS — N1831 Chronic kidney disease, stage 3a: Secondary | ICD-10-CM

## 2024-07-24 DIAGNOSIS — E559 Vitamin D deficiency, unspecified: Secondary | ICD-10-CM | POA: Insufficient documentation

## 2024-07-24 NOTE — Assessment & Plan Note (Addendum)
 She has chronic kidney disease due to other reasons This is not due to multiple myeloma It can cause elevated light chain levels

## 2024-07-24 NOTE — Progress Notes (Signed)
 [facility] OFFICE PROGRESS NOTE  Patient Care Team: Shepard Ade, MD as PCP - General (Internal Medicine) Croitoru, Jerel, MD as PCP - Cardiology (Cardiology) Lelon Glendia ONEIDA DEVONNA as Physician Assistant (Cardiology) Croitoru, Jerel, MD as Consulting Physician (Cardiology)  Assessment & Plan Bence-Jones proteinuria I have ordered extensive evaluation She does not have multiple myeloma/amyloidosis I suspect the Bence-Jones proteinuria was a lab error She would have elevated light chain regardless due to chronic kidney disease She does not need long-term follow-up Stage 3a chronic kidney disease (HCC) She has chronic kidney disease due to other reasons This is not due to multiple myeloma It can cause elevated light chain levels Vitamin D  deficiency I recommend high-dose vitamin D  replacement therapy She can follow-up with her primary care doctor  No orders of the defined types were placed in this encounter.    Katherine Bedford, MD  INTERVAL HISTORY: she returns for surveillance follow-up review of test results We discussed multiple blood work results; overall, she does not have multiple myeloma or amyloidosis  PHYSICAL EXAMINATION: ECOG PERFORMANCE STATUS: 0 - Asymptomatic  Vitals:   07/24/24 1317  BP: 120/61  Pulse: 73  Resp: 18  Temp: 98.3 F (36.8 C)  SpO2: 97%   Filed Weights   07/24/24 1317  Weight: 220 lb 9.6 oz (100.1 kg)    Relevant data reviewed during this visit included CBC, CMP, myeloma panel, vitamin D  level, bone x-ray

## 2024-07-24 NOTE — Assessment & Plan Note (Addendum)
 I recommend high-dose vitamin D  replacement therapy She can follow-up with her primary care doctor

## 2024-07-24 NOTE — Assessment & Plan Note (Addendum)
 I have ordered extensive evaluation She does not have multiple myeloma/amyloidosis I suspect the Bence-Jones proteinuria was a lab error She would have elevated light chain regardless due to chronic kidney disease She does not need long-term follow-up

## 2024-07-26 ENCOUNTER — Ambulatory Visit: Payer: Self-pay | Admitting: Physician Assistant

## 2024-08-05 DIAGNOSIS — I129 Hypertensive chronic kidney disease with stage 1 through stage 4 chronic kidney disease, or unspecified chronic kidney disease: Secondary | ICD-10-CM | POA: Diagnosis not present

## 2024-08-05 DIAGNOSIS — K219 Gastro-esophageal reflux disease without esophagitis: Secondary | ICD-10-CM | POA: Diagnosis not present

## 2024-08-05 DIAGNOSIS — Z1212 Encounter for screening for malignant neoplasm of rectum: Secondary | ICD-10-CM | POA: Diagnosis not present

## 2024-08-05 DIAGNOSIS — E785 Hyperlipidemia, unspecified: Secondary | ICD-10-CM | POA: Diagnosis not present

## 2024-08-05 DIAGNOSIS — Z1389 Encounter for screening for other disorder: Secondary | ICD-10-CM | POA: Diagnosis not present

## 2024-08-05 DIAGNOSIS — N184 Chronic kidney disease, stage 4 (severe): Secondary | ICD-10-CM | POA: Diagnosis not present

## 2024-08-12 DIAGNOSIS — I5032 Chronic diastolic (congestive) heart failure: Secondary | ICD-10-CM | POA: Diagnosis not present

## 2024-08-12 DIAGNOSIS — N184 Chronic kidney disease, stage 4 (severe): Secondary | ICD-10-CM | POA: Diagnosis not present

## 2024-08-12 DIAGNOSIS — I10 Essential (primary) hypertension: Secondary | ICD-10-CM | POA: Diagnosis not present

## 2024-08-12 DIAGNOSIS — Z0000 Encounter for general adult medical examination without abnormal findings: Secondary | ICD-10-CM | POA: Diagnosis not present

## 2024-08-12 DIAGNOSIS — Z1331 Encounter for screening for depression: Secondary | ICD-10-CM | POA: Diagnosis not present

## 2024-08-12 DIAGNOSIS — E785 Hyperlipidemia, unspecified: Secondary | ICD-10-CM | POA: Diagnosis not present

## 2024-08-12 DIAGNOSIS — K760 Fatty (change of) liver, not elsewhere classified: Secondary | ICD-10-CM | POA: Diagnosis not present

## 2024-08-12 DIAGNOSIS — Z1339 Encounter for screening examination for other mental health and behavioral disorders: Secondary | ICD-10-CM | POA: Diagnosis not present

## 2024-08-12 DIAGNOSIS — I700 Atherosclerosis of aorta: Secondary | ICD-10-CM | POA: Diagnosis not present

## 2024-08-12 DIAGNOSIS — R82998 Other abnormal findings in urine: Secondary | ICD-10-CM | POA: Diagnosis not present

## 2024-08-12 DIAGNOSIS — I130 Hypertensive heart and chronic kidney disease with heart failure and stage 1 through stage 4 chronic kidney disease, or unspecified chronic kidney disease: Secondary | ICD-10-CM | POA: Diagnosis not present

## 2024-09-01 ENCOUNTER — Other Ambulatory Visit: Payer: Self-pay

## 2024-09-02 MED ORDER — FUROSEMIDE 40 MG PO TABS: 40.0000 mg | ORAL_TABLET | Freq: Every day | ORAL | 3 refills | Status: DC

## 2024-09-12 DIAGNOSIS — Z23 Encounter for immunization: Secondary | ICD-10-CM | POA: Diagnosis not present

## 2024-10-20 ENCOUNTER — Other Ambulatory Visit: Payer: Self-pay | Admitting: Pulmonary Disease

## 2024-12-07 ENCOUNTER — Ambulatory Visit: Attending: Cardiovascular Disease | Admitting: Cardiovascular Disease

## 2024-12-07 ENCOUNTER — Encounter: Payer: Self-pay | Admitting: Cardiovascular Disease

## 2024-12-07 VITALS — BP 114/56 | HR 76 | Ht 64.0 in | Wt 222.0 lb

## 2024-12-07 DIAGNOSIS — G5603 Carpal tunnel syndrome, bilateral upper limbs: Secondary | ICD-10-CM | POA: Diagnosis not present

## 2024-12-07 DIAGNOSIS — I350 Nonrheumatic aortic (valve) stenosis: Secondary | ICD-10-CM | POA: Diagnosis not present

## 2024-12-07 DIAGNOSIS — I517 Cardiomegaly: Secondary | ICD-10-CM | POA: Diagnosis not present

## 2024-12-07 DIAGNOSIS — I10 Essential (primary) hypertension: Secondary | ICD-10-CM | POA: Diagnosis not present

## 2024-12-07 DIAGNOSIS — R002 Palpitations: Secondary | ICD-10-CM | POA: Diagnosis not present

## 2024-12-07 DIAGNOSIS — I5032 Chronic diastolic (congestive) heart failure: Secondary | ICD-10-CM | POA: Diagnosis not present

## 2024-12-07 DIAGNOSIS — R803 Bence Jones proteinuria: Secondary | ICD-10-CM | POA: Diagnosis not present

## 2024-12-07 MED ORDER — FUROSEMIDE 20 MG PO TABS: 20.0000 mg | ORAL_TABLET | ORAL | 3 refills | Status: AC

## 2024-12-07 NOTE — Patient Instructions (Signed)
 Medication Instructions:  Take Furosemide  20 mg every other day *If you need a refill on your cardiac medications before your next appointment, please call your pharmacy*  Lab Work: None ordered If you have labs (blood work) drawn today and your tests are completely normal, you will receive your results only by: MyChart Message (if you have MyChart) OR A paper copy in the mail If you have any lab test that is abnormal or we need to change your treatment, we will call you to review the results.  Testing/Procedures: None ordered  Follow-Up: At [facility], you and your health needs are our priority.  As part of our continuing mission to provide you with exceptional heart care, our providers are all part of one team.  This team includes your primary Cardiologist (physician) and Advanced Practice Providers or APPs (Physician Assistants and Nurse Practitioners) who all work together to provide you with the care you need, when you need it.  Your next appointment:   Katherine Ferrier, PA-C- 6 months  Dr Francyne- 1 yr  We recommend signing up for the patient portal called MyChart.  Sign up information is provided on this After Visit Summary.  MyChart is used to connect with patients for Virtual Visits (Telemedicine).  Patients are able to view lab/test results, encounter notes, upcoming appointments, etc.  Non-urgent messages can be sent to your provider as well.   To learn more about what you can do with MyChart, go to [email or web address].

## 2024-12-07 NOTE — Progress Notes (Signed)
 " Cardiology Office Note   Date:  12/07/2024  ID:  Katherine, Rush 11-07-1946, MRN [id number] PCP: Shepard Ade, MD  [facility] Providers Cardiologist:  Jerel Balding, MD Cardiology APP:  Lelon Glendia DASEN, PA-C     History of Present Illness Katherine Rush is a 78 y.o. female with a history of chronic HFpEF, active airway disease, severe obesity, hyperuricemia/gout, hypertension, mild aortic valve stenosis, coronary artery calcification and aortic atherosclerosis by imaging studies, chronic kidney disease stage 4, transitioning cardiology care after Dr. Allena retirement.  I took care of her mother Katherine Rush for many years until she passed away in 12-16-19.  She complains of shortness of breath when walking long distances (for example the length of our new building from the entrance to the elevator), but does not have dyspnea with self-care activities such as bathing or dressing.  She does not have orthopnea or dyspnea..  She has mild ankle edema that waxes and wanes in severity and is not necessarily proportional to the shortness of breath.  She does not have chest pain and denies dizziness, palpitations, syncope.  She has noticed resolution of her wheezing after starting treatment with Advair (her pulmonologist is Dr. Theophilus), but she continues to have exertional dyspnea.  Echocardiogram does not show definitive evidence of elevated mean left atrial pressure (impaired relaxation pattern of mitral inflow, E/e' ratio<10).  BNP in August was actually very low at only 11.  We are avoiding contrast based procedures due to her advanced chronic kidney disease.  Her nephrologist is Dr. Gearline.  Most recent creatinine was 2.26, slightly worse than her previous estimated the baseline of 1.9-2.1.  GFR is approximately 20-25.  She had a negative cardiac amyloidosis PYP scan and her hematology workup was negative as far as any meaningful monoclonal protein related abnormalities, although she  did have some evidence of Bence-Jones protein on a 24-hour urine collection..  Lipid parameters are fair with a low HDL at only 36, but within acceptable LDL at 94.  She does not have established CAD or PAD, although there is evidence of aortic and coronary atherosclerosis on imaging studies.    Studies Reviewed EKG Interpretation Date/Time:  12/15/2024 December 07 2024 10:47:13 EST Ventricular Rate:  76 PR Interval:  150 QRS Duration:  74 QT Interval:  370 QTC Calculation: 416 R Axis:   17  Text Interpretation: Normal sinus rhythm Cannot rule out Anterior infarct (cited on or before 02-Jun-2024) When compared with ECG of 02-Jun-2024 13:38, No significant change was found Confirmed by Kwaku Mostafa ([id number]) on 12/07/2024 11:06:37 AM    Echocardiogram 07/20/2024    1. Left ventricular ejection fraction, by estimation, is 60 to 65%. The left ventricle has normal function. The left ventricle has no regional wall motion abnormalities. There is mild concentric left ventricular hypertrophy. Left ventricular diastolic parameters are consistent with Grade I diastolic dysfunction (impaired relaxation).  2. Right ventricular systolic function is normal. The right ventricular size is normal. Tricuspid regurgitation signal is inadequate for assessing PA pressure.  3. The mitral valve is normal in structure. Trivial mitral valve regurgitation. No evidence of mitral stenosis.  4. The aortic valve was not well visualized. There is moderate calcification of the aortic valve. Aortic valve regurgitation is not visualized. Mild aortic valve stenosis. Aortic valve mean gradient measures 11.0 mmHg.  5. IVC not visualized.     Risk Assessment/Calculations           Physical Exam VS:  BP (!) 114/56 (  BP Location: Left Arm, Patient Position: Sitting, Cuff Size: Large)   Pulse 76   Ht 5' 4 (1.626 m)   Wt 222 lb (100.7 kg)   SpO2 97%   BMI 38.11 kg/m        Wt Readings from Last 3  Encounters:  12/07/24 222 lb (100.7 kg)  07/24/24 220 lb 9.6 oz (100.1 kg)  07/14/24 221 lb 12.8 oz (100.6 kg)    GEN: Well nourished, well developed in no acute distress, severely obese NECK: No JVD; No carotid bruits CARDIAC: RRR, early peaking 1-2/6 aortic ejection murmur, no diastolic murmurs, rubs, gallops RESPIRATORY:  Clear to auscultation without rales, wheezing or rhonchi  ABDOMEN: Soft, non-tender, non-distended EXTREMITIES: Symmetrical 1+ hard pitting retromalleolar edema; No deformity   ASSESSMENT AND PLAN Chronic HFpEF: We discussed the difficulty in assessing pulmonary versus cardiac causes of dyspnea in a patient that has both conditions.  She has mild ankle swelling, but otherwise no signs of hypervolemia.  Neither her echocardiographic Doppler findings nor her BNP level support a diagnosis of decompensated heart failure.  Over the last 6 months weight has been stable in the 220-222 pound range.  Plan to continue with conservative management, but briefly discussed the option for right heart catheterization if her condition worsens and we are confused.  She has a rather low diastolic blood pressure.  Will reduce the furosemide  to 20 mg every other day, but we also discussed the fact that if her dyspnea worsens she can go back to the daily dosing of the diuretic.  Continue Jardiance  and carvedilol . CKD 4: Followed by Dr. Gearline, baseline creatinine is around 2.0, GFR is just under 25.  Avoid nephrotoxic medications and unnecessary iodinated contrast procedures.  On SGLT2 inhibitor and ARB. HTN: Low diastolic BP.  Reduce the furosemide  to every other day.  If necessary can cut back on her carvedilol , but this medicine was also been used to suppress SVT. Aortic/Coronary atherosclerosis: Trying to avoid administration of iodinated contrast unless absolutely necessary.  Does not have angina pectoris.   HLP: Ideally would like to target LDL cholesterol under 70 especially since she also has  a rather low HDL cholesterol.  Will get the most updated lipid profile from her upcoming visit with Dr. Shepard.  There is room to increase her very low-dose of pravastatin  to 40 or even 80 mg.  Important to take the pravastatin  at bedtime. AS: Mild, unlikely to become a clinically relevant issue for many years to come.       Dispo: Reduce furosemide  to 20 mg every other day.  Call us  if you develop excessive edema or if dyspnea worsens.  Follow-up with Glendia Ferrier in 6 months.  Signed, Jerel Balding, MD   "

## 2024-12-21 ENCOUNTER — Other Ambulatory Visit: Payer: Self-pay | Admitting: Internal Medicine
